# Patient Record
Sex: Female | Born: 1995 | Race: Black or African American | Hispanic: No | Marital: Married | Smoking: Never smoker
Health system: Southern US, Community
[De-identification: ages and names within clinical notes are randomized; demographics above are authoritative.]

## PROBLEM LIST (undated history)

## (undated) ENCOUNTER — Inpatient Hospital Stay (HOSPITAL_COMMUNITY): Payer: Self-pay

## (undated) ENCOUNTER — Ambulatory Visit (HOSPITAL_COMMUNITY): Source: Home / Self Care

## (undated) DIAGNOSIS — J45909 Unspecified asthma, uncomplicated: Secondary | ICD-10-CM

## (undated) DIAGNOSIS — O24419 Gestational diabetes mellitus in pregnancy, unspecified control: Secondary | ICD-10-CM

## (undated) HISTORY — PX: NO PAST SURGERIES: SHX2092

## (undated) HISTORY — DX: Gestational diabetes mellitus in pregnancy, unspecified control: O24.419

---

## 2016-09-21 NOTE — L&D Delivery Note (Signed)
Delivery Note At 8:40 PM a viable and healthy female was delivered via Vaginal, Vacuum (Extractor)  Indication forKiwi vacuum  : prolonged bradycardia in second stage  no popoffs, used x 2 contractions at standard pressures with release between contractions. (Presentation vertex ROP rotating spontaneously to ROA during expulsion.: ;  ).  APGAR:7/9 , ; weight  pending.   Placenta status: , .  Cord:  with the following complications: .thick meconium. Nuchal cord x 1.  Cord pH: not done  Anesthesia:  none Episiotomy: None Lacerations:  1st degree only no requiring repair. Suture Repair:  Est. Blood Loss (mL):  350  Mom to postpartum.  Baby to Couplet care / Skin to Skin.  Carlus Stay V 05/14/2017, 8:54 PM   Review the Delivery Report for details.

## 2016-11-02 LAB — OB RESULTS CONSOLE RUBELLA ANTIBODY, IGM: Rubella: IMMUNE

## 2016-11-02 LAB — OB RESULTS CONSOLE ABO/RH: RH Type: POSITIVE

## 2016-11-02 LAB — OB RESULTS CONSOLE HEPATITIS B SURFACE ANTIGEN: Hepatitis B Surface Ag: NEGATIVE

## 2016-11-02 LAB — OB RESULTS CONSOLE RPR: RPR: NONREACTIVE

## 2016-11-02 LAB — OB RESULTS CONSOLE ANTIBODY SCREEN: Antibody Screen: NEGATIVE

## 2016-11-02 LAB — OB RESULTS CONSOLE HIV ANTIBODY (ROUTINE TESTING): HIV: NONREACTIVE

## 2016-11-03 ENCOUNTER — Other Ambulatory Visit (HOSPITAL_COMMUNITY): Payer: Self-pay | Admitting: Nurse Practitioner

## 2016-11-03 DIAGNOSIS — Z3A13 13 weeks gestation of pregnancy: Secondary | ICD-10-CM

## 2016-11-03 DIAGNOSIS — Z3682 Encounter for antenatal screening for nuchal translucency: Secondary | ICD-10-CM

## 2016-11-06 ENCOUNTER — Encounter (HOSPITAL_COMMUNITY): Payer: Self-pay | Admitting: *Deleted

## 2016-11-09 ENCOUNTER — Ambulatory Visit (HOSPITAL_COMMUNITY)
Admission: RE | Admit: 2016-11-09 | Discharge: 2016-11-09 | Disposition: A | Discharge status: Home / Self Care | Payer: Medicaid Other | Source: Ambulatory Visit | Attending: Nurse Practitioner | Admitting: Nurse Practitioner

## 2016-11-09 ENCOUNTER — Encounter (HOSPITAL_COMMUNITY): Payer: Self-pay

## 2016-11-09 DIAGNOSIS — Z3682 Encounter for antenatal screening for nuchal translucency: Secondary | ICD-10-CM | POA: Diagnosis present

## 2016-11-09 DIAGNOSIS — Z3A13 13 weeks gestation of pregnancy: Secondary | ICD-10-CM | POA: Insufficient documentation

## 2016-11-09 HISTORY — DX: Unspecified asthma, uncomplicated: J45.909

## 2016-11-12 ENCOUNTER — Other Ambulatory Visit: Payer: Self-pay

## 2016-12-10 ENCOUNTER — Encounter (HOSPITAL_COMMUNITY): Payer: Self-pay

## 2016-12-10 ENCOUNTER — Inpatient Hospital Stay (HOSPITAL_COMMUNITY)
Admission: AD | Admit: 2016-12-10 | Discharge: 2016-12-10 | Disposition: A | Discharge status: Home / Self Care | Payer: Medicaid Other | Source: Ambulatory Visit | Attending: Obstetrics & Gynecology | Admitting: Obstetrics & Gynecology

## 2016-12-10 DIAGNOSIS — B9689 Other specified bacterial agents as the cause of diseases classified elsewhere: Secondary | ICD-10-CM | POA: Insufficient documentation

## 2016-12-10 DIAGNOSIS — O23592 Infection of other part of genital tract in pregnancy, second trimester: Secondary | ICD-10-CM | POA: Diagnosis not present

## 2016-12-10 DIAGNOSIS — R109 Unspecified abdominal pain: Secondary | ICD-10-CM | POA: Diagnosis present

## 2016-12-10 DIAGNOSIS — O4692 Antepartum hemorrhage, unspecified, second trimester: Secondary | ICD-10-CM | POA: Diagnosis not present

## 2016-12-10 DIAGNOSIS — Z3A17 17 weeks gestation of pregnancy: Secondary | ICD-10-CM | POA: Insufficient documentation

## 2016-12-10 DIAGNOSIS — Z6790 Unspecified blood type, Rh positive: Secondary | ICD-10-CM | POA: Diagnosis not present

## 2016-12-10 DIAGNOSIS — N760 Acute vaginitis: Secondary | ICD-10-CM | POA: Diagnosis not present

## 2016-12-10 LAB — URINALYSIS, ROUTINE W REFLEX MICROSCOPIC
BILIRUBIN URINE: NEGATIVE
Glucose, UA: NEGATIVE mg/dL
Hgb urine dipstick: NEGATIVE
KETONES UR: NEGATIVE mg/dL
Nitrite: NEGATIVE
PROTEIN: NEGATIVE mg/dL
Specific Gravity, Urine: 1.026 (ref 1.005–1.030)
pH: 6 (ref 5.0–8.0)

## 2016-12-10 LAB — CBC
HCT: 37.1 % (ref 36.0–46.0)
Hemoglobin: 12.8 g/dL (ref 12.0–15.0)
MCH: 32.1 pg (ref 26.0–34.0)
MCHC: 34.5 g/dL (ref 30.0–36.0)
MCV: 93 fL (ref 78.0–100.0)
PLATELETS: 258 10*3/uL (ref 150–400)
RBC: 3.99 MIL/uL (ref 3.87–5.11)
RDW: 14.6 % (ref 11.5–15.5)
WBC: 10.9 10*3/uL — AB (ref 4.0–10.5)

## 2016-12-10 LAB — ABO/RH: ABO/RH(D): A POS

## 2016-12-10 LAB — WET PREP, GENITAL
Sperm: NONE SEEN
TRICH WET PREP: NONE SEEN
YEAST WET PREP: NONE SEEN

## 2016-12-10 MED ORDER — METRONIDAZOLE 500 MG PO TABS: 500.0000 mg | ORAL_TABLET | Freq: Two times a day (BID) | ORAL | 0 refills | Status: DC

## 2016-12-10 MED ORDER — ACETAMINOPHEN 500 MG PO TABS
1000.0000 mg | ORAL_TABLET | Freq: Four times a day (QID) | ORAL | Status: DC | PRN
  Administered 2016-12-10: 1000 mg via ORAL
  Filled 2016-12-10: qty 2

## 2016-12-10 NOTE — MAU Note (Signed)
Patient presents with lower abdominal pain for the past 3 day patient states that at first it was intermittent but today the pain has been constant. Patient also states that when she had a bowl movement today and saw some blood. She has not seen any blood since.

## 2016-12-10 NOTE — MAU Provider Note (Signed)
History     CSN: [id number]  Arrival date and time: 12/10/16 1524   First Provider Initiated Contact with Patient 12/10/16 1557      Chief Complaint  Patient presents with  . Abdominal Pain   G2P0010 @17 .5 weeks here with LAP. Pain started 2 days ago. Describes as cramping and constant today but was intermittent when it first started. Rates 5/10. She has not used anything for the pain. She reports seeing red blood in the toilet and on the toilet paper about 1 hr ago after she had a BM. Reports normal white vaginal discharge prior. She is receiving care at the [facility].    OB History    Gravida Para Term Preterm AB Living   2       1     SAB TAB Ectopic Multiple Live Births   1              Past Medical History:  Diagnosis Date  . Asthma     Past Surgical History:  Procedure Laterality Date  . NO PAST SURGERIES      History reviewed. No pertinent family history.  Social History  Substance Use Topics  . Smoking status: Never Smoker  . Smokeless tobacco: Never Used  . Alcohol use No    Allergies:  Allergies  Allergen Reactions  . Bee Venom Anaphylaxis, Shortness Of Breath and Swelling    Prescriptions Prior to Admission  Medication Sig Dispense Refill Last Dose  . Prenatal Vit-Fe Fumarate-FA (PRENATAL VITAMIN PO) Take 1 tablet by mouth daily.    12/10/2016 at Unknown time    Review of Systems  Constitutional: Negative for fever.  Gastrointestinal: Positive for abdominal pain. Negative for constipation.  Genitourinary: Positive for vaginal bleeding and vaginal discharge. Negative for dysuria.   Physical Exam   Blood pressure (!) 117/56, pulse 86, temperature 98 F (36.7 C), temperature source Oral, resp. rate 16, last menstrual period 08/08/2016.  Physical Exam  Nursing note and vitals reviewed. Constitutional: She is oriented to person, place, and time. She appears well-developed and well-nourished. No distress.  HENT:  Head: Normocephalic and atraumatic.   Neck: Normal range of motion.  Cardiovascular: Normal rate.   Respiratory: Effort normal.  GI: Soft. She exhibits no distension and no mass. There is no tenderness. There is no rebound and no guarding.  Genitourinary:  Genitourinary Comments: External: no lesions or erythema Vagina: rugated, nulli, thin white frothy discharge SVE: closed/long   Musculoskeletal: Normal range of motion.  Neurological: She is alert and oriented to person, place, and time.  Skin: Skin is warm and dry.  Psychiatric: She has a normal mood and affect.  FHT: 150 bpm  Bedside US: active fetus, subj nml AFV, FHR 150s.  Results for orders placed or performed during the hospital encounter of 12/10/16 (from the past 24 hour(s))  Urinalysis, Routine w reflex microscopic     Status: Abnormal   Collection Time: 12/10/16  3:37 PM  Result Value Ref Range   Color, Urine YELLOW YELLOW   APPearance HAZY (A) CLEAR   Specific Gravity, Urine 1.026 1.005 - 1.030   pH 6.0 5.0 - 8.0   Glucose, UA NEGATIVE NEGATIVE mg/dL   Hgb urine dipstick NEGATIVE NEGATIVE   Bilirubin Urine NEGATIVE NEGATIVE   Ketones, ur NEGATIVE NEGATIVE mg/dL   Protein, ur NEGATIVE NEGATIVE mg/dL   Nitrite NEGATIVE NEGATIVE   Leukocytes, UA TRACE (A) NEGATIVE   RBC / HPF 0-5 0 - 5 RBC/hpf   WBC,  UA 6-30 0 - 5 WBC/hpf   Bacteria, UA FEW (A) NONE SEEN   Squamous Epithelial / LPF 6-30 (A) NONE SEEN   Mucous PRESENT   Wet prep, genital     Status: Abnormal   Collection Time: 12/10/16  4:08 PM  Result Value Ref Range   Yeast Wet Prep HPF POC NONE SEEN NONE SEEN   Trich, Wet Prep NONE SEEN NONE SEEN   Clue Cells Wet Prep HPF POC PRESENT (A) NONE SEEN   WBC, Wet Prep HPF POC MANY (A) NONE SEEN   Sperm NONE SEEN   ABO/Rh     Status: None (Preliminary result)   Collection Time: 12/10/16  4:13 PM  Result Value Ref Range   ABO/RH(D) A POS   CBC     Status: Abnormal   Collection Time: 12/10/16  4:13 PM  Result Value Ref Range   WBC 10.9 (H) 4.0 -  10.5 K/uL   RBC 3.99 3.87 - 5.11 MIL/uL   Hemoglobin 12.8 12.0 - 15.0 g/dL   HCT 86.537.1 78.436.0 - 69.646.0 %   MCV 93.0 78.0 - 100.0 fL   MCH 32.1 26.0 - 34.0 pg   MCHC 34.5 30.0 - 36.0 g/dL   RDW 29.514.6 28.411.5 - 13.215.5 %   Platelets 258 150 - 400 K/uL   MAU Course  Procedures Tylenol 1g po  MDM Labs ordered and reviewed. No evidence of UTI or threatened SAB. Pain likely caused by BV and/or physiologic to pregnancy. Stable for discharge home.   Assessment and Plan   1. [redacted] weeks gestation of pregnancy   2. Bacterial vaginosis   3.      Vaginal bleeding in pregnancy 4.      Rh pos  [facility] SAB precautions Rx Flagyl Tylenol and heating pad prn  Allergies as of 12/10/2016      Reactions   Bee Venom Anaphylaxis, Shortness Of Breath, Swelling      Medication List    TAKE these medications   metroNIDAZOLE 500 MG tablet Commonly known as:  FLAGYL Take 1 tablet (500 mg total) by mouth 2 (two) times daily.   PRENATAL VITAMIN PO Take 1 tablet by mouth daily.       Donette LarryMelanie Aundria Bitterman, CNM 12/10/2016, 3:59 PM

## 2016-12-10 NOTE — Discharge Instructions (Signed)
Bacterial Vaginosis Bacterial vaginosis is an infection of the vagina. It happens when too many germs (bacteria) grow in the vagina. This infection puts you at risk for infections from sex (STIs). Treating this infection can lower your risk for some STIs. You should also treat this if you are pregnant. It can cause your baby to be born early. Follow these instructions at home: Medicines   Take over-the-counter and prescription medicines only as told by your doctor.  Take or use your antibiotic medicine as told by your doctor. Do not stop taking or using it even if you start to feel better. General instructions   If you your sexual partner is a woman, tell her that you have this infection. She needs to get treatment if she has symptoms. If you have a female partner, he does not need to be treated.  During treatment:  Avoid sex.  Do not douche.  Avoid alcohol as told.  Avoid breastfeeding as told.  Drink enough fluid to keep your pee (urine) clear or pale yellow.  Keep your vagina and butt (rectum) clean.  Wash the area with warm water every day.  Wipe from front to back after you use the toilet.  Keep all follow-up visits as told by your doctor. This is important. Preventing this condition   Do not douche.  Use only warm water to wash around your vagina.  Use protection when you have sex. This includes:  Latex condoms.  Dental dams.  Limit how many people you have sex with. It is best to only have sex with the same person (be monogamous).  Get tested for STIs. Have your partner get tested.  Wear underwear that is cotton or lined with cotton.  Avoid tight pants and pantyhose. This is most important in summer.  Do not use any products that have nicotine or tobacco in them. These include cigarettes and e-cigarettes. If you need help quitting, ask your doctor.  Do not use illegal drugs.  Limit how much alcohol you drink. Contact a doctor if:  Your symptoms do not  get better, even after you are treated.  You have more discharge or pain when you pee (urinate).  You have a fever.  You have pain in your belly (abdomen).  You have pain with sex.  Your bleed from your vagina between periods. Summary  This infection happens when too many germs (bacteria) grow in the vagina.  Treating this condition can lower your risk for some infections from sex (STIs).  You should also treat this if you are pregnant. It can cause early (premature) birth.  Do not stop taking or using your antibiotic medicine even if you start to feel better. This information is not intended to replace advice given to you by your health care provider. Make sure you discuss any questions you have with your health care provider. Document Released: 06/16/2008 Document Revised: 05/23/2016 Document Reviewed: 05/23/2016 Elsevier Interactive Patient Education  2017 [employer]  Second Trimester of Pregnancy The second trimester is from week 13 through week 28, month 4 through 6. This is often the time in pregnancy that you feel your best. Often times, morning sickness has lessened or quit. You may have more energy, and you may get hungry more often. Your unborn baby (fetus) is growing rapidly. At the end of the sixth month, he or she is about 9 inches long and weighs about 1 pounds. You will likely feel the baby move (quickening) between 18 and 20 weeks of  pregnancy. Follow these instructions at home:  Avoid all smoking, herbs, and alcohol. Avoid drugs not approved by your doctor.  Do not use any tobacco products, including cigarettes, chewing tobacco, and electronic cigarettes. If you need help quitting, ask your doctor. You may get counseling or other support to help you quit.  Only take medicine as told by your doctor. Some medicines are safe and some are not during pregnancy.  Exercise only as told by your doctor. Stop exercising if you start having cramps.  Eat regular, healthy  meals.  Wear a good support bra if your breasts are tender.  Do not use hot tubs, steam rooms, or saunas.  Wear your seat belt when driving.  Avoid raw meat, uncooked cheese, and liter boxes and soil used by cats.  Take your prenatal vitamins.  Take 1500-2000 milligrams of calcium daily starting at the 20th week of pregnancy until you deliver your baby.  Try taking medicine that helps you poop (stool softener) as needed, and if your doctor approves. Eat more fiber by eating fresh fruit, vegetables, and whole grains. Drink enough fluids to keep your pee (urine) clear or pale yellow.  Take warm water baths (sitz baths) to soothe pain or discomfort caused by hemorrhoids. Use hemorrhoid cream if your doctor approves.  If you have puffy, bulging veins (varicose veins), wear support hose. Raise (elevate) your feet for 15 minutes, 3-4 times a day. Limit salt in your diet.  Avoid heavy lifting, wear low heals, and sit up straight.  Rest with your legs raised if you have leg cramps or low back pain.  Visit your dentist if you have not gone during your pregnancy. Use a soft toothbrush to brush your teeth. Be gentle when you floss.  You can have sex (intercourse) unless your doctor tells you not to.  Go to your doctor visits. Get help if:  You feel dizzy.  You have mild cramps or pressure in your lower belly (abdomen).  You have a nagging pain in your belly area.  You continue to feel sick to your stomach (nauseous), throw up (vomit), or have watery poop (diarrhea).  You have bad smelling fluid coming from your vagina.  You have pain with peeing (urination). Get help right away if:  You have a fever.  You are leaking fluid from your vagina.  You have spotting or bleeding from your vagina.  You have severe belly cramping or pain.  You lose or gain weight rapidly.  You have trouble catching your breath and have chest pain.  You notice sudden or extreme puffiness (swelling)  of your face, hands, ankles, feet, or legs.  You have not felt the baby move in over an hour.  You have severe headaches that do not go away with medicine.  You have vision changes. This information is not intended to replace advice given to you by your health care provider. Make sure you discuss any questions you have with your health care provider. Document Released: 12/02/2009 Document Revised: 02/13/2016 Document Reviewed: 11/08/2012 Elsevier Interactive Patient Education  2017 [employer].

## 2016-12-11 ENCOUNTER — Other Ambulatory Visit: Payer: Self-pay | Admitting: Student

## 2016-12-11 ENCOUNTER — Telehealth (HOSPITAL_COMMUNITY): Payer: Self-pay | Admitting: *Deleted

## 2016-12-11 DIAGNOSIS — N760 Acute vaginitis: Principal | ICD-10-CM

## 2016-12-11 DIAGNOSIS — B9689 Other specified bacterial agents as the cause of diseases classified elsewhere: Secondary | ICD-10-CM

## 2016-12-11 LAB — GC/CHLAMYDIA PROBE AMP (~~LOC~~) NOT AT ARMC
CHLAMYDIA, DNA PROBE: NEGATIVE
NEISSERIA GONORRHEA: NEGATIVE

## 2016-12-11 MED ORDER — METRONIDAZOLE 500 MG PO TABS: 500.0000 mg | ORAL_TABLET | Freq: Two times a day (BID) | ORAL | 0 refills | Status: DC

## 2017-01-12 ENCOUNTER — Inpatient Hospital Stay (HOSPITAL_COMMUNITY)
Admission: AD | Admit: 2017-01-12 | Discharge: 2017-01-12 | Disposition: A | Discharge status: Home / Self Care | Payer: Medicaid Other | Source: Ambulatory Visit | Attending: Family Medicine | Admitting: Family Medicine

## 2017-01-12 ENCOUNTER — Encounter (HOSPITAL_COMMUNITY): Payer: Self-pay | Admitting: *Deleted

## 2017-01-12 DIAGNOSIS — Z3A22 22 weeks gestation of pregnancy: Secondary | ICD-10-CM | POA: Insufficient documentation

## 2017-01-12 DIAGNOSIS — O219 Vomiting of pregnancy, unspecified: Secondary | ICD-10-CM | POA: Diagnosis not present

## 2017-01-12 DIAGNOSIS — Z888 Allergy status to other drugs, medicaments and biological substances status: Secondary | ICD-10-CM | POA: Insufficient documentation

## 2017-01-12 DIAGNOSIS — Z79899 Other long term (current) drug therapy: Secondary | ICD-10-CM | POA: Insufficient documentation

## 2017-01-12 DIAGNOSIS — A09 Infectious gastroenteritis and colitis, unspecified: Secondary | ICD-10-CM

## 2017-01-12 DIAGNOSIS — O99512 Diseases of the respiratory system complicating pregnancy, second trimester: Secondary | ICD-10-CM | POA: Insufficient documentation

## 2017-01-12 LAB — COMPREHENSIVE METABOLIC PANEL
ALT: 20 U/L (ref 14–54)
AST: 23 U/L (ref 15–41)
Albumin: 3 g/dL — ABNORMAL LOW (ref 3.5–5.0)
Alkaline Phosphatase: 53 U/L (ref 38–126)
Anion gap: 8 (ref 5–15)
BUN: <5 mg/dL — ABNORMAL LOW (ref 6–20)
CHLORIDE: 102 mmol/L (ref 101–111)
CO2: 22 mmol/L (ref 22–32)
Calcium: 9.1 mg/dL (ref 8.9–10.3)
Creatinine, Ser: 0.44 mg/dL (ref 0.44–1.00)
GFR calc Af Amer: >60 mL/min (ref >60)
Glucose, Bld: 91 mg/dL (ref 65–99)
Potassium: 3.6 mmol/L (ref 3.5–5.1)
Sodium: 132 mmol/L — ABNORMAL LOW (ref 135–145)
Total Bilirubin: 0.7 mg/dL (ref 0.3–1.2)
Total Protein: 7 g/dL (ref 6.5–8.1)

## 2017-01-12 LAB — URINALYSIS, ROUTINE W REFLEX MICROSCOPIC
BILIRUBIN URINE: NEGATIVE
Glucose, UA: NEGATIVE mg/dL
HGB URINE DIPSTICK: NEGATIVE
Ketones, ur: NEGATIVE mg/dL
Leukocytes, UA: NEGATIVE
Nitrite: NEGATIVE
Protein, ur: NEGATIVE mg/dL
SPECIFIC GRAVITY, URINE: 1.015 (ref 1.005–1.030)
pH: 5 (ref 5.0–8.0)

## 2017-01-12 LAB — CBC
HCT: 37.5 % (ref 36.0–46.0)
Hemoglobin: 13 g/dL (ref 12.0–15.0)
MCH: 32.9 pg (ref 26.0–34.0)
MCHC: 34.7 g/dL (ref 30.0–36.0)
MCV: 94.9 fL (ref 78.0–100.0)
PLATELETS: 228 10*3/uL (ref 150–400)
RBC: 3.95 MIL/uL (ref 3.87–5.11)
RDW: 14.7 % (ref 11.5–15.5)
WBC: 12.6 10*3/uL — AB (ref 4.0–10.5)

## 2017-01-12 MED ORDER — ACETAMINOPHEN 325 MG PO TABS
650.0000 mg | ORAL_TABLET | Freq: Four times a day (QID) | ORAL | Status: DC | PRN
Start: 2017-01-12 — End: 2017-01-12
  Administered 2017-01-12: 650 mg via ORAL
  Filled 2017-01-12: qty 2

## 2017-01-12 MED ORDER — METOCLOPRAMIDE HCL 10 MG PO TABS
10.0000 mg | ORAL_TABLET | Freq: Once | ORAL | Status: AC
  Administered 2017-01-12: 10 mg via ORAL

## 2017-01-12 MED ORDER — METOCLOPRAMIDE HCL 10 MG PO TABS
5.0000 mg | ORAL_TABLET | Freq: Once | ORAL | Status: DC
  Filled 2017-01-12: qty 1

## 2017-01-12 MED ORDER — METOCLOPRAMIDE HCL 5 MG PO TABS: 5.0000 mg | ORAL_TABLET | Freq: Four times a day (QID) | ORAL | 1 refills | Status: DC

## 2017-01-12 NOTE — Discharge Instructions (Signed)
Food Choices to Help Relieve Diarrhea, Adult When you have diarrhea, the foods you eat and your eating habits are very important. Choosing the right foods and drinks can help:  Relieve diarrhea.  Replace lost fluids and nutrients.  Prevent dehydration.  What general guidelines should I follow? Relieving diarrhea  Choose foods with less than 2 g or .07 oz. of fiber per serving.  Limit fats to less than 8 tsp (38 g or 1.34 oz.) a day.  Avoid the following: ? Foods and beverages sweetened with high-fructose corn syrup, honey, or sugar alcohols such as xylitol, sorbitol, and mannitol. ? Foods that contain a lot of fat or sugar. ? Fried, greasy, or spicy foods. ? High-fiber grains, breads, and cereals. ? Raw fruits and vegetables.  Eat foods that are rich in probiotics. These foods include dairy products such as yogurt and fermented milk products. They help increase healthy bacteria in the stomach and intestines (gastrointestinal tract, or GI tract).  If you have lactose intolerance, avoid dairy products. These may make your diarrhea worse.  Take medicine to help stop diarrhea (antidiarrheal medicine) only as told by your health care provider. Replacing nutrients  Eat small meals or snacks every 3-4 hours.  Eat bland foods, such as white rice, toast, or baked potato, until your diarrhea starts to get better. Gradually reintroduce nutrient-rich foods as tolerated or as told by your health care provider. This includes: ? Well-cooked protein foods. ? Peeled, seeded, and soft-cooked fruits and vegetables. ? Low-fat dairy products.  Take vitamin and mineral supplements as told by your health care provider. Preventing dehydration   Start by sipping water or a special solution to prevent dehydration (oral rehydration solution, ORS). Urine that is clear or pale yellow means that you are getting enough fluid.  Try to drink at least 8-10 cups of fluid each day to help replace lost  fluids.  You may add other liquids in addition to water, such as clear juice or decaffeinated sports drinks, as tolerated or as told by your health care provider.  Avoid drinks with caffeine, such as coffee, tea, or soft drinks.  Avoid alcohol. What foods are recommended? The items listed may not be a complete list. Talk with your health care provider about what dietary choices are best for you. Grains White rice. White, French, or pita breads (fresh or toasted), including plain rolls, buns, or bagels. White pasta. Saltine, soda, or graham crackers. Pretzels. Low-fiber cereal. Cooked cereals made with water (such as cornmeal, farina, or cream cereals). Plain muffins. Matzo. Melba toast. Zwieback. Vegetables Potatoes (without the skin). Most well-cooked and canned vegetables without skins or seeds. Tender lettuce. Fruits Apple sauce. Fruits canned in juice. Cooked apricots, cherries, grapefruit, peaches, pears, or plums. Fresh bananas and cantaloupe. Meats and other protein foods Baked or boiled chicken. Eggs. Tofu. Fish. Seafood. Smooth nut butters. Ground or well-cooked tender beef, ham, veal, lamb, pork, or poultry. Dairy Plain yogurt, kefir, and unsweetened liquid yogurt. Lactose-free milk, buttermilk, skim milk, or soy milk. Low-fat or nonfat hard cheese. Beverages Water. Low-calorie sports drinks. Fruit juices without pulp. Strained tomato and vegetable juices. Decaffeinated teas. Sugar-free beverages not sweetened with sugar alcohols. Oral rehydration solutions, if approved by your health care provider. Seasoning and other foods Bouillon, broth, or soups made from recommended foods. What foods are not recommended? The items listed may not be a complete list. Talk with your health care provider about what dietary choices are best for you. Grains Whole grain, whole wheat,   bran, or rye breads, rolls, pastas, and crackers. Wild or brown rice. Whole grain or bran cereals. Barley. Oats and  oatmeal. Corn tortillas or taco shells. Granola. Popcorn. Vegetables Raw vegetables. Fried vegetables. Cabbage, broccoli, Brussels sprouts, artichokes, baked beans, beet greens, corn, kale, legumes, peas, sweet potatoes, and yams. Potato skins. Cooked spinach and cabbage. Fruits Dried fruit, including raisins and dates. Raw fruits. Stewed or dried prunes. Canned fruits with syrup. Meat and other protein foods Fried or fatty meats. Deli meats. Chunky nut butters. Nuts and seeds. Beans and lentils. Bacon. Hot dogs. Sausage. Dairy High-fat cheeses. Whole milk, chocolate milk, and beverages made with milk, such as milk shakes. Half-and-half. Cream. sour cream. Ice cream. Beverages Caffeinated beverages (such as coffee, tea, soda, or energy drinks). Alcoholic beverages. Fruit juices with pulp. Prune juice. Soft drinks sweetened with high-fructose corn syrup or sugar alcohols. High-calorie sports drinks. Fats and oils Butter. Cream sauces. Margarine. Salad oils. Plain salad dressings. Olives. Avocados. Mayonnaise. Sweets and desserts Sweet rolls, doughnuts, and sweet breads. Sugar-free desserts sweetened with sugar alcohols such as xylitol and sorbitol. Seasoning and other foods Honey. Hot sauce. Chili powder. Gravy. Cream-based or milk-based soups. Pancakes and waffles. Summary  When you have diarrhea, the foods you eat and your eating habits are very important.  Make sure you get at least 8-10 cups of fluid each day, or enough to keep your urine clear or pale yellow.  Eat bland foods and gradually reintroduce healthy, nutrient-rich foods as tolerated, or as told by your health care provider.  Avoid high-fiber, fried, greasy, or spicy foods. This information is not intended to replace advice given to you by your health care provider. Make sure you discuss any questions you have with your health care provider. Document Released: 11/28/2003 Document Revised: 09/04/2016 Document Reviewed:  09/04/2016 Elsevier Interactive Patient Education  2017 [employer]vier Inc.  

## 2017-01-12 NOTE — MAU Provider Note (Signed)
History    Maria Dudley is a G2P0010 at [redacted]w[redacted]d Here with complaints of nausea, vomiting and diarrhea since yesterday morning. She denies bleeding, unusual vaginal discharge. She does feel fetal movements.      CSN: [id number]  Arrival date and time: 01/12/17 1243   First Provider Initiated Contact with Patient 01/12/17 1349      Chief Complaint  Patient presents with  . Emesis During Pregnancy  . Diarrhea   Diarrhea   The current episode started yesterday. The problem occurs 2 to 4 times per day (soft stool yesterday, diarrhea overnight, and then one small hard stool this morning). The problem has been gradually improving. The stool consistency is described as watery. Associated symptoms include vomiting.  Emesis   This is a new problem. The current episode started yesterday (threw up several times yesterday, then did not throw upovernight. She then threw up at 4 am and 8 am.  She has only had crackers and ginger ale yesterday, but nothing to eat today. ). The problem occurs 5 to 10 times per day. The problem has been unchanged. There has been no fever. Associated symptoms include diarrhea. She has tried diet change and increased fluids for the symptoms. The treatment provided no relief.    OB History    Gravida Para Term Preterm AB Living   2       1     SAB TAB Ectopic Multiple Live Births   1              Past Medical History:  Diagnosis Date  . Asthma     Past Surgical History:  Procedure Laterality Date  . NO PAST SURGERIES      History reviewed. No pertinent family history.  Social History  Substance Use Topics  . Smoking status: Never Smoker  . Smokeless tobacco: Never Used  . Alcohol use No    Allergies:  Allergies  Allergen Reactions  . Bee Venom Anaphylaxis, Shortness Of Breath and Swelling    Prescriptions Prior to Admission  Medication Sig Dispense Refill Last Dose  . metroNIDAZOLE (FLAGYL) 500 MG tablet Take 1 tablet (500 mg total) by  mouth 2 (two) times daily. 14 tablet 0   . Prenatal Vit-Fe Fumarate-FA (PRENATAL VITAMIN PO) Take 1 tablet by mouth daily.    12/10/2016 at Unknown time    Review of Systems  HENT: Negative.   Respiratory: Negative.   Cardiovascular: Negative.   Gastrointestinal: Positive for diarrhea and vomiting.  Genitourinary: Negative.   Musculoskeletal: Negative.    Physical Exam   Blood pressure 116/64, pulse (!) 110, temperature 98.9 F (37.2 C), temperature source Oral, resp. rate 16, height  (1.702 m), weight 63 kg (139 lb), last menstrual period 08/08/2016, SpO2 98 %.  Physical Exam  Constitutional: She is oriented to person, place, and time. She appears well-developed.  HENT:  Head: Normocephalic.  Neck: Normal range of motion.  Respiratory: Effort normal.  GI: Soft. She exhibits no distension and no mass. There is no tenderness. There is no rebound and no guarding.  Musculoskeletal: Normal range of motion.  Neurological: She is alert and oriented to person, place, and time. She has normal reflexes.  Skin: Skin is warm and dry.  Psychiatric: She has a normal mood and affect.    MAU Course  Procedures  MDM -Reglan 10 mg PO, Tylenol POfor pain -has not had any Vomiting or diarrhea since she's been here -ate crackers and juice without vomiting. Feels better;  wants to go home -FHR is 159 by doppler Assessment and Plan   1. Gastrointestinal infection    2. Patient stable for discharge; instructed patient on food choices to relieve diarrhea and vomiting  Charlesetta Garibaldi Asani Deniston 01/12/2017, 1:49 PM

## 2017-01-12 NOTE — MAU Note (Signed)
Pt C/O vomiting & diarrhea since yesterday morning.  Unable to hold down anything.  Four diarrhea stools since yesterday.  Has Abdominal pain when vomiting.  Denies bleeding.

## 2017-04-12 LAB — OB RESULTS CONSOLE GBS: STREP GROUP B AG: NEGATIVE

## 2017-04-12 LAB — OB RESULTS CONSOLE GC/CHLAMYDIA
CHLAMYDIA, DNA PROBE: NEGATIVE
GC PROBE AMP, GENITAL: NEGATIVE

## 2017-04-22 ENCOUNTER — Encounter (HOSPITAL_COMMUNITY): Payer: Self-pay | Admitting: *Deleted

## 2017-04-22 ENCOUNTER — Inpatient Hospital Stay (HOSPITAL_COMMUNITY)
Admission: AD | Admit: 2017-04-22 | Discharge: 2017-04-23 | Disposition: A | Discharge status: Home / Self Care | Payer: Medicaid Other | Source: Ambulatory Visit | Attending: Obstetrics and Gynecology | Admitting: Obstetrics and Gynecology

## 2017-04-22 DIAGNOSIS — O479 False labor, unspecified: Secondary | ICD-10-CM

## 2017-04-22 DIAGNOSIS — Z79899 Other long term (current) drug therapy: Secondary | ICD-10-CM | POA: Insufficient documentation

## 2017-04-22 DIAGNOSIS — J45909 Unspecified asthma, uncomplicated: Secondary | ICD-10-CM | POA: Insufficient documentation

## 2017-04-22 DIAGNOSIS — O4703 False labor before 37 completed weeks of gestation, third trimester: Secondary | ICD-10-CM | POA: Insufficient documentation

## 2017-04-22 DIAGNOSIS — Z3A36 36 weeks gestation of pregnancy: Secondary | ICD-10-CM | POA: Insufficient documentation

## 2017-04-22 DIAGNOSIS — O368390 Maternal care for abnormalities of the fetal heart rate or rhythm, unspecified trimester, not applicable or unspecified: Secondary | ICD-10-CM

## 2017-04-22 DIAGNOSIS — O99513 Diseases of the respiratory system complicating pregnancy, third trimester: Secondary | ICD-10-CM | POA: Insufficient documentation

## 2017-04-22 DIAGNOSIS — Z888 Allergy status to other drugs, medicaments and biological substances status: Secondary | ICD-10-CM | POA: Insufficient documentation

## 2017-04-22 DIAGNOSIS — Z809 Family history of malignant neoplasm, unspecified: Secondary | ICD-10-CM | POA: Insufficient documentation

## 2017-04-22 MED ORDER — LACTATED RINGERS IV BOLUS (SEPSIS): 1000.0000 mL | Freq: Once | INTRAVENOUS | Status: DC

## 2017-04-22 NOTE — MAU Provider Note (Signed)
Chief Complaint:  Labor Eval   First Provider Initiated Contact with Patient 04/22/2017 at 2253.    HPI: Maria Dudley is a 21 y.o. G2P0010 at 5461w5d who presents to maternity admissions reporting losing mucus plug and contractions. Was a term Charity fundraiserN labor check, but CNM was CTBS at 2253 for FHR decel the occurred w/ pt lying on her back during cervical exam.   Associated signs and symptoms: Neg for LOF, VB. Good fetal movement.   Pregnancy Course: uncomplicated. [facility]  Past Medical History:  Diagnosis Date  . Asthma    OB History  Gravida Para Term Preterm AB Living  2       1    SAB TAB Ectopic Multiple Live Births  1            # Outcome Date GA Lbr Len/2nd Weight Sex Delivery Anes PTL Lv  2 Current           1 SAB              Past Surgical History:  Procedure Laterality Date  . NO PAST SURGERIES     Family History  Problem Relation Age of Onset  . Cancer Maternal Grandfather    Social History  Substance Use Topics  . Smoking status: Never Smoker  . Smokeless tobacco: Never Used  . Alcohol use No   Allergies  Allergen Reactions  . Bee Venom Anaphylaxis, Shortness Of Breath and Swelling   Prescriptions Prior to Admission  Medication Sig Dispense Refill Last Dose  . Prenatal Vit-Fe Fumarate-FA (PRENATAL VITAMIN PO) Take 1 tablet by mouth daily.    04/22/2017 at Unknown time  . PROVENTIL HFA 108 (90 Base) MCG/ACT inhaler Inhale 1-2 puffs into the lungs every 4 (four) hours as needed.  0 04/21/2017 at Unknown time  . metoCLOPramide (REGLAN) 5 MG tablet Take 1 tablet (5 mg total) by mouth 4 (four) times daily. 10 tablet 1 More than a month at Unknown time  . metroNIDAZOLE (FLAGYL) 500 MG tablet Take 1 tablet (500 mg total) by mouth 2 (two) times daily. (Patient not taking: Reported on 01/12/2017) 14 tablet 0 More than a month at Unknown time    I have reviewed patient's Past Medical Hx, Surgical Hx, Family Hx, Social Hx,  medications and allergies.   ROS:  Review of Systems  Gastrointestinal: Positive for abdominal pain (contractions only).  Genitourinary: Negative for vaginal bleeding and vaginal discharge.    Physical Exam  Patient Vitals for the past 24 hrs:  BP Temp Temp src Pulse Resp Height Weight  04/22/17 2244 122/74 97.8 F (36.6 C) Oral 81 16 5\' 7"  (1.702 m) 165 lb (74.8 kg)   Constitutional: Well-developed, well-nourished female in no acute distress.  Cardiovascular: normal rate Respiratory: normal effort GI: Abd soft, non-tender, gravid appropriate for gestational age. Neurologic: Alert and oriented x 4.  GU: Closed/long cervix no bleeding, LOF per RN.   FHT:  Baseline 130 , moderate variability, accelerations present, 4 minute prolonged deceleration down to 90's while pt supine for cervical exam. Resolved w/ rolling to right lateral. IV bolus started and O2 given. No further decels on 1 hour 20 minutes of prolonged monitoring Contractions: Irreg, mild   Labs: No results found for this or any previous visit (from the past 24 hour(s)).  Imaging:  BPP 8/8, AFI 9.17, Vtx.  MAU Course: Orders Placed This Encounter  Procedures  . US MFM FETAL BPP WO NON STRESS  .  Contraction - monitoring  . External fetal heart monitoring  . Vaginal exam  . Oxygen therapy Mode or (Route): Non-rebreather; Liters Per Minute: 10  . Insert peripheral IV  . Discharge patient   Meds ordered this encounter  Medications  . lactated ringers bolus 1,000 mL   Discussed decel, exam w/ Dr. Vergie LivingPickens. New orders: BPP, AFI.   Discussed BPP, AFI results w/ Dr. Vergie LivingPickens. May D/C home.   MDM: - Mild, irreg contractions C/W False labor - FHR decel likely 2/2 maternal position. Fetal status overall reassuring.   Assessment: 1. Fetal heart rate decelerations affecting management of mother   2. False labor    Plan: Discharge home in stable condition.  Labor precautions and fetal kick counts  Follow-up  Information    [facility], [facility] up on 04/26/2017.   Why:  as scheduled Contact information: [address] [place] [id number] [phone]        [facility] AD[facility] Follow up.   Why:  in pregnancy emergencies Contact information: [address] [id number] [place]c [place] [id number] [phone]          Allergies as of 04/23/2017      Reactions   Bee Venom Anaphylaxis, Shortness Of Breath, Swelling      Medication List    TAKE these medications   metoCLOPramide 5 MG tablet Commonly known as:  REGLAN Take 1 tablet (5 mg total) by mouth 4 (four) times daily.   metroNIDAZOLE 500 MG tablet Commonly known as:  FLAGYL Take 1 tablet (500 mg total) by mouth 2 (two) times daily.   PRENATAL VITAMIN PO Take 1 tablet by mouth daily.   PROVENTIL HFA 108 (90 Base) MCG/ACT inhaler Generic drug:  albuterol Inhale 1-2 puffs into the lungs every 4 (four) hours as needed.       Katrinka BlazingSmith, [place], CNM 04/22/2017 11:02 PM

## 2017-04-23 ENCOUNTER — Inpatient Hospital Stay (HOSPITAL_COMMUNITY): Payer: Medicaid Other

## 2017-04-23 DIAGNOSIS — O368390 Maternal care for abnormalities of the fetal heart rate or rhythm, unspecified trimester, not applicable or unspecified: Secondary | ICD-10-CM | POA: Diagnosis not present

## 2017-04-23 DIAGNOSIS — O4703 False labor before 37 completed weeks of gestation, third trimester: Secondary | ICD-10-CM | POA: Diagnosis not present

## 2017-04-23 DIAGNOSIS — O99513 Diseases of the respiratory system complicating pregnancy, third trimester: Secondary | ICD-10-CM | POA: Diagnosis present

## 2017-04-23 DIAGNOSIS — Z79899 Other long term (current) drug therapy: Secondary | ICD-10-CM | POA: Diagnosis not present

## 2017-04-23 DIAGNOSIS — Z3A36 36 weeks gestation of pregnancy: Secondary | ICD-10-CM | POA: Diagnosis not present

## 2017-04-23 DIAGNOSIS — Z888 Allergy status to other drugs, medicaments and biological substances status: Secondary | ICD-10-CM | POA: Diagnosis not present

## 2017-04-23 DIAGNOSIS — O479 False labor, unspecified: Secondary | ICD-10-CM | POA: Diagnosis not present

## 2017-04-23 DIAGNOSIS — Z809 Family history of malignant neoplasm, unspecified: Secondary | ICD-10-CM | POA: Diagnosis not present

## 2017-04-23 DIAGNOSIS — J45909 Unspecified asthma, uncomplicated: Secondary | ICD-10-CM | POA: Diagnosis not present

## 2017-04-23 NOTE — MAU Note (Signed)
Urine in lab 

## 2017-04-23 NOTE — Discharge Instructions (Signed)
Braxton Hicks Contractions °Contractions of the uterus can occur throughout pregnancy, but they are not always a sign that you are in labor. You may have practice contractions called Braxton Hicks contractions. These false labor contractions are sometimes confused with true labor. °What are Braxton Hicks contractions? °Braxton Hicks contractions are tightening movements that occur in the muscles of the uterus before labor. Unlike true labor contractions, these contractions do not result in opening (dilation) and thinning of the cervix. Toward the end of pregnancy (32-34 weeks), Braxton Hicks contractions can happen more often and may become stronger. These contractions are sometimes difficult to tell apart from true labor because they can be very uncomfortable. You should not feel embarrassed if you go to the hospital with false labor. °Sometimes, the only way to tell if you are in true labor is for your health care provider to look for changes in the cervix. The health care provider will do a physical exam and may monitor your contractions. If you are not in true labor, the exam should show that your cervix is not dilating and your water has not broken. °If there are no prenatal problems or other health problems associated with your pregnancy, it is completely safe for you to be sent home with false labor. You may continue to have Braxton Hicks contractions until you go into true labor. °How can I tell the difference between true labor and false labor? °· Differences °? False labor °? Contractions last 30-70 seconds.: Contractions are usually shorter and not as strong as true labor contractions. °? Contractions become very regular.: Contractions are usually irregular. °? Discomfort is usually felt in the top of the uterus, and it spreads to the lower abdomen and low back.: Contractions are often felt in the front of the lower abdomen and in the groin. °? Contractions do not go away with walking.: Contractions may  go away when you walk around or change positions while lying down. °? Contractions usually become more intense and increase in frequency.: Contractions get weaker and are shorter-lasting as time goes on. °? The cervix dilates and gets thinner.: The cervix usually does not dilate or become thin. °Follow these instructions at home: °· Take over-the-counter and prescription medicines only as told by your health care provider. °· Keep up with your usual exercises and follow other instructions from your health care provider. °· Eat and drink lightly if you think you are going into labor. °· If Braxton Hicks contractions are making you uncomfortable: °? Change your position from lying down or resting to walking, or change from walking to resting. °? Sit and rest in a tub of warm water. °? Drink enough fluid to keep your urine clear or pale yellow. Dehydration may cause these contractions. °? Do slow and deep breathing several times an hour. °· Keep all follow-up prenatal visits as told by your health care provider. This is important. °Contact a health care provider if: °· You have a fever. °· You have continuous pain in your abdomen. °Get help right away if: °· Your contractions become stronger, more regular, and closer together. °· You have fluid leaking or gushing from your vagina. °· You pass blood-tinged mucus (bloody show). °· You have bleeding from your vagina. °· You have low back pain that you never had before. °· You feel your baby’s head pushing down and causing pelvic pressure. °· Your baby is not moving inside you as much as it used to. °Summary °· Contractions that occur before labor are   called Braxton Hicks contractions, false labor, or practice contractions. °· Braxton Hicks contractions are usually shorter, weaker, farther apart, and less regular than true labor contractions. True labor contractions usually become progressively stronger and regular and they become more frequent. °· Manage discomfort from  Braxton Hicks contractions by changing position, resting in a warm bath, drinking plenty of water, or practicing deep breathing. °This information is not intended to replace advice given to you by your health care provider. Make sure you discuss any questions you have with your health care provider. °Document Released: 09/07/2005 Document Revised: 07/27/2016 Document Reviewed: 07/27/2016 °Elsevier Interactive Patient Education © 2017 Elsevier Inc. ° ° °Fetal Movement Counts °Patient Name: ________________________________________________ Patient Due Date: ____________________ °What is a fetal movement count? °A fetal movement count is the number of times that you feel your baby move during a certain amount of time. This may also be called a fetal kick count. A fetal movement count is recommended for every pregnant woman. You may be asked to start counting fetal movements as early as week 28 of your pregnancy. °Pay attention to when your baby is most active. You may notice your baby's sleep and wake cycles. You may also notice things that make your baby move more. You should do a fetal movement count: °· When your baby is normally most active. °· At the same time each day. ° °A good time to count movements is while you are resting, after having something to eat and drink. °How do I count fetal movements? °1. Find a quiet, comfortable area. Sit, or lie down on your side. °2. Write down the date, the start time and stop time, and the number of movements that you felt between those two times. Take this information with you to your health care visits. °3. For 2 hours, count kicks, flutters, swishes, rolls, and jabs. You should feel at least 10 movements during 2 hours. °4. You may stop counting after you have felt 10 movements. °5. If you do not feel 10 movements in 2 hours, have something to eat and drink. Then, keep resting and counting for 1 hour. If you feel at least 4 movements during that hour, you may stop  counting. °Contact a health care provider if: °· You feel fewer than 4 movements in 2 hours. °· Your baby is not moving like he or she usually does. °Date: ____________ Start time: ____________ Stop time: ____________ Movements: ____________ °Date: ____________ Start time: ____________ Stop time: ____________ Movements: ____________ °Date: ____________ Start time: ____________ Stop time: ____________ Movements: ____________ °Date: ____________ Start time: ____________ Stop time: ____________ Movements: ____________ °Date: ____________ Start time: ____________ Stop time: ____________ Movements: ____________ °Date: ____________ Start time: ____________ Stop time: ____________ Movements: ____________ °Date: ____________ Start time: ____________ Stop time: ____________ Movements: ____________ °Date: ____________ Start time: ____________ Stop time: ____________ Movements: ____________ °Date: ____________ Start time: ____________ Stop time: ____________ Movements: ____________ °This information is not intended to replace advice given to you by your health care provider. Make sure you discuss any questions you have with your health care provider. °Document Released: 10/07/2006 Document Revised: 05/06/2016 Document Reviewed: 10/17/2015 °Elsevier Interactive Patient Education © 2018 Elsevier Inc. ° °

## 2017-05-11 ENCOUNTER — Inpatient Hospital Stay (HOSPITAL_COMMUNITY)
Admission: AD | Admit: 2017-05-11 | Discharge: 2017-05-11 | Disposition: A | Discharge status: Home / Self Care | Payer: Medicaid Other | Source: Ambulatory Visit | Attending: Family Medicine | Admitting: Family Medicine

## 2017-05-11 ENCOUNTER — Encounter (HOSPITAL_COMMUNITY): Payer: Self-pay

## 2017-05-11 DIAGNOSIS — Z3A39 39 weeks gestation of pregnancy: Secondary | ICD-10-CM | POA: Diagnosis not present

## 2017-05-11 DIAGNOSIS — O471 False labor at or after 37 completed weeks of gestation: Secondary | ICD-10-CM | POA: Diagnosis present

## 2017-05-11 DIAGNOSIS — O479 False labor, unspecified: Secondary | ICD-10-CM

## 2017-05-11 LAB — URINALYSIS, ROUTINE W REFLEX MICROSCOPIC
BILIRUBIN URINE: NEGATIVE
Glucose, UA: NEGATIVE mg/dL
Hgb urine dipstick: NEGATIVE
KETONES UR: NEGATIVE mg/dL
Leukocytes, UA: NEGATIVE
Nitrite: NEGATIVE
Protein, ur: NEGATIVE mg/dL
SPECIFIC GRAVITY, URINE: 1.015 (ref 1.005–1.030)
pH: 6 (ref 5.0–8.0)

## 2017-05-11 NOTE — Progress Notes (Addendum)
G2P0 @ 39.[redacted] wksga. Presents to triage for r/o labor. Denies LOF or bleeding. + FM. EFM applied.   SVE: closed  1530: provider notified. Report status of pt given. Orders received to cont monitor for another hr from last decel.  1620: Provider notified. Report status of fetal monitoring given. Ordered to prepare for discharge pt home. Monitors taken off and informed to to get dressed.   1700: discharge instructions given with pt understanding. Pt left unit via ambulatory.

## 2017-05-11 NOTE — MAU Note (Signed)
Urine in lab 

## 2017-05-13 ENCOUNTER — Encounter (HOSPITAL_COMMUNITY): Payer: Self-pay

## 2017-05-13 ENCOUNTER — Inpatient Hospital Stay (HOSPITAL_COMMUNITY): Payer: Medicaid Other

## 2017-05-13 ENCOUNTER — Inpatient Hospital Stay (EMERGENCY_DEPARTMENT_HOSPITAL)
Admission: AD | Admit: 2017-05-13 | Discharge: 2017-05-13 | Disposition: A | Discharge status: Home / Self Care | Payer: Medicaid Other | Source: Ambulatory Visit | Attending: Obstetrics & Gynecology | Admitting: Obstetrics & Gynecology

## 2017-05-13 DIAGNOSIS — O368390 Maternal care for abnormalities of the fetal heart rate or rhythm, unspecified trimester, not applicable or unspecified: Secondary | ICD-10-CM

## 2017-05-13 DIAGNOSIS — O479 False labor, unspecified: Secondary | ICD-10-CM

## 2017-05-13 NOTE — Discharge Instructions (Signed)
Braxton Hicks Contractions °Contractions of the uterus can occur throughout pregnancy, but they are not always a sign that you are in labor. You may have practice contractions called Braxton Hicks contractions. These false labor contractions are sometimes confused with true labor. °What are Braxton Hicks contractions? °Braxton Hicks contractions are tightening movements that occur in the muscles of the uterus before labor. Unlike true labor contractions, these contractions do not result in opening (dilation) and thinning of the cervix. Toward the end of pregnancy (32-34 weeks), Braxton Hicks contractions can happen more often and may become stronger. These contractions are sometimes difficult to tell apart from true labor because they can be very uncomfortable. You should not feel embarrassed if you go to the hospital with false labor. °Sometimes, the only way to tell if you are in true labor is for your health care provider to look for changes in the cervix. The health care provider will do a physical exam and may monitor your contractions. If you are not in true labor, the exam should show that your cervix is not dilating and your water has not broken. °If there are no prenatal problems or other health problems associated with your pregnancy, it is completely safe for you to be sent home with false labor. You may continue to have Braxton Hicks contractions until you go into true labor. °How can I tell the difference between true labor and false labor? °· Differences °? False labor °? Contractions last 30-70 seconds.: Contractions are usually shorter and not as strong as true labor contractions. °? Contractions become very regular.: Contractions are usually irregular. °? Discomfort is usually felt in the top of the uterus, and it spreads to the lower abdomen and low back.: Contractions are often felt in the front of the lower abdomen and in the groin. °? Contractions do not go away with walking.: Contractions may  go away when you walk around or change positions while lying down. °? Contractions usually become more intense and increase in frequency.: Contractions get weaker and are shorter-lasting as time goes on. °? The cervix dilates and gets thinner.: The cervix usually does not dilate or become thin. °Follow these instructions at home: °· Take over-the-counter and prescription medicines only as told by your health care provider. °· Keep up with your usual exercises and follow other instructions from your health care provider. °· Eat and drink lightly if you think you are going into labor. °· If Braxton Hicks contractions are making you uncomfortable: °? Change your position from lying down or resting to walking, or change from walking to resting. °? Sit and rest in a tub of warm water. °? Drink enough fluid to keep your urine clear or pale yellow. Dehydration may cause these contractions. °? Do slow and deep breathing several times an hour. °· Keep all follow-up prenatal visits as told by your health care provider. This is important. °Contact a health care provider if: °· You have a fever. °· You have continuous pain in your abdomen. °Get help right away if: °· Your contractions become stronger, more regular, and closer together. °· You have fluid leaking or gushing from your vagina. °· You pass blood-tinged mucus (bloody show). °· You have bleeding from your vagina. °· You have low back pain that you never had before. °· You feel your baby’s head pushing down and causing pelvic pressure. °· Your baby is not moving inside you as much as it used to. °Summary °· Contractions that occur before labor are   called Braxton Hicks contractions, false labor, or practice contractions. °· Braxton Hicks contractions are usually shorter, weaker, farther apart, and less regular than true labor contractions. True labor contractions usually become progressively stronger and regular and they become more frequent. °· Manage discomfort from  Braxton Hicks contractions by changing position, resting in a warm bath, drinking plenty of water, or practicing deep breathing. °This information is not intended to replace advice given to you by your health care provider. Make sure you discuss any questions you have with your health care provider. °Document Released: 09/07/2005 Document Revised: 07/27/2016 Document Reviewed: 07/27/2016 °Elsevier Interactive Patient Education © 2017 Elsevier Inc. ° ° °Fetal Movement Counts °Patient Name: ________________________________________________ Patient Due Date: ____________________ °What is a fetal movement count? °A fetal movement count is the number of times that you feel your baby move during a certain amount of time. This may also be called a fetal kick count. A fetal movement count is recommended for every pregnant woman. You may be asked to start counting fetal movements as early as week 28 of your pregnancy. °Pay attention to when your baby is most active. You may notice your baby's sleep and wake cycles. You may also notice things that make your baby move more. You should do a fetal movement count: °· When your baby is normally most active. °· At the same time each day. ° °A good time to count movements is while you are resting, after having something to eat and drink. °How do I count fetal movements? °1. Find a quiet, comfortable area. Sit, or lie down on your side. °2. Write down the date, the start time and stop time, and the number of movements that you felt between those two times. Take this information with you to your health care visits. °3. For 2 hours, count kicks, flutters, swishes, rolls, and jabs. You should feel at least 10 movements during 2 hours. °4. You may stop counting after you have felt 10 movements. °5. If you do not feel 10 movements in 2 hours, have something to eat and drink. Then, keep resting and counting for 1 hour. If you feel at least 4 movements during that hour, you may stop  counting. °Contact a health care provider if: °· You feel fewer than 4 movements in 2 hours. °· Your baby is not moving like he or she usually does. °Date: ____________ Start time: ____________ Stop time: ____________ Movements: ____________ °Date: ____________ Start time: ____________ Stop time: ____________ Movements: ____________ °Date: ____________ Start time: ____________ Stop time: ____________ Movements: ____________ °Date: ____________ Start time: ____________ Stop time: ____________ Movements: ____________ °Date: ____________ Start time: ____________ Stop time: ____________ Movements: ____________ °Date: ____________ Start time: ____________ Stop time: ____________ Movements: ____________ °Date: ____________ Start time: ____________ Stop time: ____________ Movements: ____________ °Date: ____________ Start time: ____________ Stop time: ____________ Movements: ____________ °Date: ____________ Start time: ____________ Stop time: ____________ Movements: ____________ °This information is not intended to replace advice given to you by your health care provider. Make sure you discuss any questions you have with your health care provider. °Document Released: 10/07/2006 Document Revised: 05/06/2016 Document Reviewed: 10/17/2015 °Elsevier Interactive Patient Education © 2018 Elsevier Inc. ° °

## 2017-05-13 NOTE — MAU Note (Signed)
+  contractions 5-10 minutes Rating 6/10  +vaginal discharge Brown/reddish in color  Last SVE was closed--this week

## 2017-05-13 NOTE — MAU Provider Note (Signed)
CC:  Chief Complaint  Patient presents with  . Contractions  . Vaginal Discharge    First Provider Initiated Contact with Patient 05/13/2017 at 1617.    HPI: Maria Dudley is a 21 y.o. year old G61P0010 female at [redacted]w[redacted]d weeks gestation who presents to MAU reporting mild contractions and scant bloody show. Cervic closed per RN w/ no bleeding, but CNM was asked to see pt 2/2 few decels.   Associated Sx:  Vaginal bleeding: Scant Leaking of fluid: Denies Fetal movement: Nml  O: Patient Vitals for the past 24 hrs:  BP Temp Temp src Pulse Resp SpO2 Weight  05/13/17 1517 115/68 97.7 F (36.5 C) Oral 78 16 98 % 171 lb 1.3 oz (77.6 kg)    General: NAD Heart: Regular rate Lungs: Normal rate and effort Abd: Soft, NT, Gravid, S=D Pelvic: NEFG, Neg pooling, No blood.  Dilation: Closed Effacement (%): 50 Cervical Position: Posterior Station: -2 Presentation: Vertex Exam by:: Ginnie Smart RN  EFM: 140, Moderate variability, 1 15 x 15 acceleration on initail tracing, but multiple 15x 15 accels after BPP. Few mild variable decelerations. One early decel.  Toco: Contractions rare, mild  A: [redacted]w[redacted]d week IUP Braxton Hicks Scant bloody show Few decels, but fetal status reassuring w/ FHR reactive, BPP 10/10 and Neg CST.   P: Discharge home in stable condition per consult with Adam Phenix, MD. Labor labor precautions and fetal kick counts. Follow-up as scheduled for prenatal visit or sooner as needed if symptoms worsen. Return to maternity admissions as needed if symptoms worsen.  [place], CNM 09/14/2016 11:40 PM  3

## 2017-05-14 ENCOUNTER — Inpatient Hospital Stay (HOSPITAL_COMMUNITY)
Admission: AD | Admit: 2017-05-14 | Discharge: 2017-05-16 | DRG: 775 | Disposition: A | Discharge status: Home / Self Care | Payer: Medicaid Other | Source: Ambulatory Visit | Attending: Family Medicine | Admitting: Family Medicine

## 2017-05-14 ENCOUNTER — Encounter (HOSPITAL_COMMUNITY): Payer: Self-pay | Admitting: *Deleted

## 2017-05-14 DIAGNOSIS — Z3493 Encounter for supervision of normal pregnancy, unspecified, third trimester: Secondary | ICD-10-CM | POA: Diagnosis present

## 2017-05-14 DIAGNOSIS — O479 False labor, unspecified: Secondary | ICD-10-CM | POA: Diagnosis not present

## 2017-05-14 DIAGNOSIS — J45909 Unspecified asthma, uncomplicated: Secondary | ICD-10-CM

## 2017-05-14 DIAGNOSIS — IMO0002 Reserved for concepts with insufficient information to code with codable children: Secondary | ICD-10-CM

## 2017-05-14 DIAGNOSIS — J45901 Unspecified asthma with (acute) exacerbation: Secondary | ICD-10-CM | POA: Diagnosis present

## 2017-05-14 DIAGNOSIS — O9952 Diseases of the respiratory system complicating childbirth: Secondary | ICD-10-CM | POA: Diagnosis present

## 2017-05-14 DIAGNOSIS — Z3A39 39 weeks gestation of pregnancy: Secondary | ICD-10-CM | POA: Diagnosis not present

## 2017-05-14 DIAGNOSIS — Z62810 Personal history of physical and sexual abuse in childhood: Secondary | ICD-10-CM

## 2017-05-14 LAB — CBC
HEMATOCRIT: 43.2 % (ref 36.0–46.0)
HEMOGLOBIN: 15.4 g/dL — AB (ref 12.0–15.0)
MCH: 33.6 pg (ref 26.0–34.0)
MCHC: 35.6 g/dL (ref 30.0–36.0)
MCV: 94.1 fL (ref 78.0–100.0)
Platelets: 196 10*3/uL (ref 150–400)
RBC: 4.59 MIL/uL (ref 3.87–5.11)
RDW: 12.9 % (ref 11.5–15.5)
WBC: 12.8 10*3/uL — ABNORMAL HIGH (ref 4.0–10.5)

## 2017-05-14 LAB — TYPE AND SCREEN
ABO/RH(D): A POS
Antibody Screen: NEGATIVE

## 2017-05-14 MED ORDER — SENNOSIDES-DOCUSATE SODIUM 8.6-50 MG PO TABS
2.0000 | ORAL_TABLET | ORAL | Status: DC
  Administered 2017-05-15 – 2017-05-16 (×2): 2 via ORAL
  Filled 2017-05-14 (×2): qty 2

## 2017-05-14 MED ORDER — LACTATED RINGERS IV SOLN
500.0000 mL | INTRAVENOUS | Status: DC | PRN
Start: 2017-05-14 — End: 2017-05-14
  Administered 2017-05-14: 500 mL via INTRAVENOUS

## 2017-05-14 MED ORDER — ACETAMINOPHEN 325 MG PO TABS: 650.0000 mg | ORAL_TABLET | ORAL | Status: DC | PRN

## 2017-05-14 MED ORDER — TETANUS-DIPHTH-ACELL PERTUSSIS 5-2.5-18.5 LF-MCG/0.5 IM SUSP: 0.5000 mL | Freq: Once | INTRAMUSCULAR | Status: DC

## 2017-05-14 MED ORDER — ONDANSETRON HCL 4 MG/2ML IJ SOLN
4.0000 mg | Freq: Four times a day (QID) | INTRAMUSCULAR | Status: DC | PRN
  Administered 2017-05-14: 4 mg via INTRAVENOUS
  Filled 2017-05-14: qty 2

## 2017-05-14 MED ORDER — FENTANYL CITRATE (PF) 100 MCG/2ML IJ SOLN: 100.0000 ug | INTRAMUSCULAR | Status: DC | PRN

## 2017-05-14 MED ORDER — ONDANSETRON HCL 4 MG/2ML IJ SOLN: 4.0000 mg | INTRAMUSCULAR | Status: DC | PRN

## 2017-05-14 MED ORDER — OXYCODONE-ACETAMINOPHEN 5-325 MG PO TABS
2.0000 | ORAL_TABLET | ORAL | Status: DC | PRN
Start: 2017-05-14 — End: 2017-05-14

## 2017-05-14 MED ORDER — LACTATED RINGERS IV SOLN
INTRAVENOUS | Status: DC
  Administered 2017-05-14: 14:00:00 via INTRAVENOUS

## 2017-05-14 MED ORDER — IBUPROFEN 600 MG PO TABS
600.0000 mg | ORAL_TABLET | Freq: Four times a day (QID) | ORAL | Status: DC
  Administered 2017-05-15 – 2017-05-16 (×5): 600 mg via ORAL
  Filled 2017-05-14 (×6): qty 1

## 2017-05-14 MED ORDER — ONDANSETRON HCL 4 MG PO TABS: 4.0000 mg | ORAL_TABLET | ORAL | Status: DC | PRN

## 2017-05-14 MED ORDER — DIPHENHYDRAMINE HCL 25 MG PO CAPS: 25.0000 mg | ORAL_CAPSULE | Freq: Four times a day (QID) | ORAL | Status: DC | PRN

## 2017-05-14 MED ORDER — OXYCODONE-ACETAMINOPHEN 5-325 MG PO TABS: 1.0000 | ORAL_TABLET | ORAL | Status: DC | PRN

## 2017-05-14 MED ORDER — OXYTOCIN BOLUS FROM INFUSION: 500.0000 mL | Freq: Once | INTRAVENOUS | Status: DC

## 2017-05-14 MED ORDER — DIBUCAINE 1 % RE OINT
1.0000 "application " | TOPICAL_OINTMENT | RECTAL | Status: DC | PRN
  Filled 2017-05-14: qty 28

## 2017-05-14 MED ORDER — LIDOCAINE HCL (PF) 1 % IJ SOLN
30.0000 mL | INTRAMUSCULAR | Status: DC | PRN
  Filled 2017-05-14: qty 30

## 2017-05-14 MED ORDER — COCONUT OIL OIL: 1.0000 "application " | TOPICAL_OIL | Status: DC | PRN

## 2017-05-14 MED ORDER — WITCH HAZEL-GLYCERIN EX PADS
1.0000 "application " | MEDICATED_PAD | CUTANEOUS | Status: DC | PRN
  Administered 2017-05-16: 1 via TOPICAL

## 2017-05-14 MED ORDER — OXYTOCIN 40 UNITS IN LACTATED RINGERS INFUSION - SIMPLE MED
2.5000 [IU]/h | INTRAVENOUS | Status: DC
  Filled 2017-05-14: qty 1000

## 2017-05-14 MED ORDER — PRENATAL MULTIVITAMIN CH
1.0000 | ORAL_TABLET | Freq: Every day | ORAL | Status: DC
  Administered 2017-05-15 – 2017-05-16 (×2): 1 via ORAL
  Filled 2017-05-14 (×2): qty 1

## 2017-05-14 MED ORDER — BENZOCAINE-MENTHOL 20-0.5 % EX AERO
1.0000 "application " | INHALATION_SPRAY | CUTANEOUS | Status: DC | PRN
  Administered 2017-05-15: 1 via TOPICAL
  Filled 2017-05-14: qty 56

## 2017-05-14 MED ORDER — SOD CITRATE-CITRIC ACID 500-334 MG/5ML PO SOLN: 30.0000 mL | ORAL | Status: DC | PRN

## 2017-05-14 MED ORDER — ZOLPIDEM TARTRATE 5 MG PO TABS
5.0000 mg | ORAL_TABLET | Freq: Every evening | ORAL | Status: DC | PRN
Start: 2017-05-14 — End: 2017-05-16

## 2017-05-14 MED ORDER — SIMETHICONE 80 MG PO CHEW: 80.0000 mg | CHEWABLE_TABLET | ORAL | Status: DC | PRN

## 2017-05-14 NOTE — Consult Note (Signed)
Neonatology Note:   Attendance at C-section:    I was asked by Dr. Feruson to attend this vaginal deivery for thick meconium and decels at term. The mother is a G2P0010, GBS negative with good prenatal care. ROM 1 hours before delivery, fluid thick meconium. Infant vigorous with good spontaneous cry and tone. Needed only minimal bulb suctioning. Dried and stimulated.  Ap 8/9. Lungs clear to ausc in DR. To CN to care of Pediatrician.  Maria Dudley C. Maria Gritz, MD 

## 2017-05-14 NOTE — Anesthesia Pain Management Evaluation Note (Signed)
  CRNA Pain Management Visit Note  Patient: Maria Dudley, 21 y.o., female  "Hello I am a member of the anesthesia team at [facility]. We have an anesthesia team available at all times to provide care throughout the hospital, including epidural management and anesthesia for C-section. I don't know your plan for the delivery whether it a natural birth, water birth, IV sedation, nitrous supplementation, doula or epidural, but we want to meet your pain goals."   1.Was your pain managed to your expectations on prior hospitalizations?   Yes   2.What is your expectation for pain management during this hospitalization?     Labor support without medications  3.How can we help you reach that goal? Standby  Record the patient's initial score and the patient's pain goal.   Pain: 6  Pain Goal: 8 [facility] wants you to be able to say your pain was always managed very well.  [name] 05/14/2017

## 2017-05-14 NOTE — MAU Note (Signed)
Decreased fetal heart rate at 1117, pt. Repositioned audible increase in fetal heart rate, patient repositioned with another RN at the [facility] to start IV. Prolonged decel noted with return to a baseline of 120s, after nursing interventions, Dr. Frances Furbish notified.  After stablizing patient, patient transferred to L&D, upon admission to L&D, FHR 120s.

## 2017-05-14 NOTE — Progress Notes (Signed)
Central monitoring down. RN at bedside.

## 2017-05-14 NOTE — Progress Notes (Signed)
Labor Progress Note  Maria Dudley is a 21 y.o. female G2P0010 with IUP at [redacted]w[redacted]d by LMP c/w 1st trimester U/S presenting for SOL.  S: Patient seen & examined for progress of labor. Patient in moderate distress, especially during contractions, and is laying on right side.  O: BP 111/67 (BP Location: Right Arm)   Pulse (!) 116   Temp 98.7 F (37.1 C) (Oral)   Resp 20   Ht 5\' 7"  (1.702 m)   Wt 76.7 kg (169 lb)   LMP 08/08/2016   SpO2 100%   BMI 26.47 kg/m   FHT: 145 bpm, mod var, +accels, early and variable decels TOCO: q3-30min, patient is very uncomfortable during contractions but is breathing well through them  CVE: Dilation: 8 Effacement (%): 80, 90 Cervical Position: Middle Station: -1 Presentation: Vertex Exam by:: Earlene Plater, RN   A&P: 21 y.o. G2P0010 [redacted]w[redacted]d here for SOL  Labor:Pt progressing well without medication, continue expectant management Fetal Wellbeing:Cat I Pain Control: NO gas Anticipated MOD:SVD   Obie Dredge Medical Student 05/14/2017 6:34 PM

## 2017-05-14 NOTE — MAU Note (Signed)
Hardie Shackleton, RN assume care of patient, report received from charge nurse.
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
## 2017-05-14 NOTE — Progress Notes (Signed)
Labor Progress Note  S: Patient seen & examined for progress of labor. Patient is uncomfortable during contractions and is breathing through them.   O: BP (!) 106/56 (BP Location: Left Arm)   Pulse 74   Temp 98.4 F (36.9 C) (Oral)   Resp 18   Ht 5\' 7"  (1.702 m)   Wt 76.7 kg (169 lb)   LMP 08/08/2016   SpO2 99%   BMI 26.47 kg/m   FHT: 140bpm, mod var, +accels, some variable decels TOCO: q69min, patient looks comfortable during contractions  CVE: Dilation: 6 Effacement (%): 80, 90 Cervical Position: Middle Station: -1 Presentation: Vertex Exam by:: Deatra Robinson, RN   A&P: 21 y.o. G2P0010 [redacted]w[redacted]d here for SOL with decelerations in FHT  Was encouraged to ask for pain medications if she wants/needs them. Will continue to monitor FHT closely and adjust management depending on tracing. Patient is progressing well - continue expectant management Anticipate SVD  Lezlie Octave, MD [facility] Resident PGY-1 05/14/2017 3:14 PM

## 2017-05-15 LAB — CBC
HEMATOCRIT: 35.9 % — AB (ref 36.0–46.0)
HEMOGLOBIN: 12.5 g/dL (ref 12.0–15.0)
MCH: 33 pg (ref 26.0–34.0)
MCHC: 34.8 g/dL (ref 30.0–36.0)
MCV: 94.7 fL (ref 78.0–100.0)
Platelets: 177 10*3/uL (ref 150–400)
RBC: 3.79 MIL/uL — ABNORMAL LOW (ref 3.87–5.11)
RDW: 12.9 % (ref 11.5–15.5)
WBC: 17.5 10*3/uL — ABNORMAL HIGH (ref 4.0–10.5)

## 2017-05-15 LAB — RPR: RPR Ser Ql: NONREACTIVE

## 2017-05-15 MED ORDER — IPRATROPIUM-ALBUTEROL 0.5-2.5 (3) MG/3ML IN SOLN
3.0000 mL | Freq: Once | RESPIRATORY_TRACT | Status: AC
  Administered 2017-05-15: 3 mL via RESPIRATORY_TRACT
  Filled 2017-05-15: qty 3

## 2017-05-15 MED ORDER — ALBUTEROL SULFATE (2.5 MG/3ML) 0.083% IN NEBU
2.5000 mg | INHALATION_SOLUTION | RESPIRATORY_TRACT | Status: DC | PRN
  Filled 2017-05-15: qty 3

## 2017-05-15 NOTE — Progress Notes (Addendum)
Post Partum Day 1 Subjective: no complaints, up ad lib, voiding and tolerating PO Pt had episode of SOB/chest tightness similar to previous asthma attacks. She usually uses albuterol inhaler PRN at home.  Objective: Blood pressure 107/65, pulse 85, temperature 98.1 F (36.7 C), temperature source Axillary, resp. rate (!) 24, height 5\' 7"  (1.702 m), weight 169 lb (76.7 kg), last menstrual period 08/08/2016, SpO2 98 %.  Nebulizer treatment with albuterol/ipratropium given by respiratory staff x 1 dose  Physical Exam:  General: alert, cooperative and no distress  HEART: normal rate, heart sounds, regular rhythm RESP: normal effort, lung sounds clear and equal bilaterally Lochia: appropriate Uterine Fundus: firm Incision: n/a DVT Evaluation: No evidence of DVT seen on physical exam.   Recent Labs  05/14/17 1200 05/15/17 0537  HGB 15.4* 12.5  HCT 43.2 35.9*    Assessment/Plan: Plan for discharge tomorrow, Breastfeeding and Contraception condoms  Albuterol rescue inhaler PRN while in hospital   LOS: 1 day   Sharen Counter 05/15/2017, 9:21 PM

## 2017-05-15 NOTE — Lactation Note (Signed)
This note was copied from a baby's chart. Lactation Consultation Note  Patient Name: Maria Dudley Date: 05/15/2017 Reason for consult: Initial assessment  Baby is 13 hours old at the start of the [facility] consult When [facility] entered the room the [facility] had assisted mom to latch the baby on the right breast with #20 NS. LC noted the baby to be latched , but pulling away or falling asleep.  LC released baby from the breast, woke him up , and worked with mom to latch without the NS due the areola  Being more compressible after reverse pressure and hand expressing, few drops of colostrum noted.  At 1st baby was latched shallow, and sucking on tongue and lip. LC had baby suck on a gloved finger and  Used tongue exercises to get baby to relax tongue,immediately after the pulling out the finger from the baby's  Mouth latched and baby fed for 4 mins with swallows, FISH lips/ depth obtained. Pumping the other breast also  Helped to enhance the let down on the right.  Baby STS and then started to root. LC switched and worked with baby to latch on the left / football/ depth obtained/ and fed 6 mins with swallows. LC had mom pumped on the right when feeding on the left/ mom started cramping/ and colostrum flowing/  More swallows on the left/ and depth.  After feeding baby laying STS on moms chest.  Mom seems very motivated to breast feed and will need assistance until the baby is more consistent with latching.  Dad and grandmother are supportive.   Lactation Plan of Care:  LC instructed mom on the use of shells for between feeding except when sleeping.  ( indicated due to resolving areola edema ) ( mom aware of how to use them )  Prior to latch - breast massage , hand express( mom does it well ) , pre-pump if needed with hand pump,  Reverse pressure , latch with breast compressions until swallows, and then intermittent.  Post pump with her own DEBP [facility] showed mom how to use it  ).    Maternal Data Has patient been taught Hand Expression?: Yes Does the patient have breastfeeding experience prior to this delivery?: No  Feeding Feeding Type: Breast Fed (left breast ) Length of feed: 6 min (increased swallows noted )  LATCH Score Latch: Grasps breast easily, tongue down, lips flanged, rhythmical sucking.  Audible Swallowing: A few with stimulation  Type of Nipple: Everted at rest and after stimulation (areola compressible )  Comfort (Breast/Nipple): Soft / non-tender  Hold (Positioning): Assistance needed to correctly position infant at breast and maintain latch.  LATCH Score: 8  Interventions Interventions: Breast feeding basics reviewed  Lactation Tools Discussed/Used Tools: Shells (mom plans to wear with bra when not STS ) Nipple shield size:  (not needed for latch ) Shell Type: Inverted Breast pump type:  (had mom pump left breast until swallows on right ) Pump Review: Setup, frequency, and cleaning Initiated by:: MAI  Date initiated:: 05/15/17   Consult Status Consult Status: Follow-up Date: 05/16/17 Follow-up type: In-patient    Maria Dudley 05/15/2017, 11:35 AM

## 2017-05-15 NOTE — Progress Notes (Signed)
Patient ID: Maria Dudley, female   DOB: 25-Dec-1995, 21 y.o.   MRN: [id number]  S: Called by RN to report pt feeling chest tightness and shortness of breath.  Pt has hx of asthma and feels like she needs her inhaler.    O: BP 107/65 (BP Location: Left Arm)   Pulse 85   Temp 98.1 F (36.7 C) (Axillary)   Resp (!) 24   Ht 5\' 7"  (1.702 m)   Wt 169 lb (76.7 kg)   LMP 08/08/2016   SpO2 98%   BMI 26.47 kg/m    A: Likely asthma exacerbation  P: Duoneb treatment from respiratory x 1, then PRN inhaler   Sharen Counter, CNM 6:57 PM
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
## 2017-05-16 MED ORDER — IBUPROFEN 600 MG PO TABS: 600.0000 mg | ORAL_TABLET | Freq: Four times a day (QID) | ORAL | 0 refills | Status: DC

## 2017-05-16 NOTE — Discharge Instructions (Signed)

## 2017-05-16 NOTE — Lactation Note (Signed)
This note was copied from a baby's chart. Lactation Consultation Note  Patient Name: Maria Dudley UXLKG'M Date: 05/16/2017 Reason for consult: Follow-up assessment;Infant weight loss  Baby is 65 hours old  As LC entered the room baby latched on the right breast. In modified  Laid back position. Multiple swallows noted and per mom comfortable.  Latch without the Nipple Shield. Per mom the baby latches so much better  Without the Nipple Shield. When baby released the areola improved for  Being compressible compared to yesterday. Per mom has been using the  Breast shells when not STS, and pumping with her DEBP.  Baby acting still hungry , LC assisted to latch on the left breast modified laid back,  With depth and baby fed for 15 mins , multiple swallows, increased with breast compressions.  After baby came off 1st breast  , when baby was crying noted the baby to have a short  Frenulum. Is not causing latch issues if the depth is achieved. If depth isn't achieved  Shallow uncomfortable latch. May be a problem when the milk comes in and also  Was a problem with latching yesterday due to areola edema. Nipple shield was started  Early on and yesterday , LC worked with mom and was able to latch the baby without the NS Both breast.  LC shared findings with facility NP Barnetta Chapel  and she planned to discuss the [facility]  Findings with the Dr. Erik Obey. LC also recommended to mom when returning to [facility] children to have the Dr.  Otelia Santee and the short frenulum.  Sore nipple and engorgement prevention and tx reviewed.  Discussed nutritive vs non - nutritive feeding patterns and to watch for hanging out at the breast.  LC stressed to mom when the baby is latched the baby needs to be in and intermittent feeding pattern  With swallows. Stressed the importance of breast compressions until swallows.  Mom has a hand pump for pre-pump and per Medela of post pumping if needed.  LC praised mom  for her efforts and grandmother for her breast feeding support.   LC mentioned to mom is the [facility] doctor decide to release short frenulum call back for  [facility] O/P appt appt. For feeding assessment. Mom receptive.    Maternal Data Has patient been taught Hand Expression?: Yes  Feeding Feeding Type: Breast Fed Length of feed: 24 min (per mom start time, and multiple swallows )  LATCH Score Latch: Grasps breast easily, tongue down, lips flanged, rhythmical sucking.  Audible Swallowing: Spontaneous and intermittent  Type of Nipple: Everted at rest and after stimulation  Comfort (Breast/Nipple): Filling, red/small blisters or bruises, mild/mod discomfort  Hold (Positioning): Assistance needed to correctly position infant at breast and maintain latch.  LATCH Score: 8  Interventions Interventions: Breast feeding basics reviewed;Assisted with latch;Skin to skin;Breast massage;Hand express;Pre-pump if needed;Reverse pressure;Breast compression;Adjust position;Support pillows;Position options;Expressed milk;Shells;Hand pump;DEBP  Lactation Tools Discussed/Used Tools: Shells Nipple shield size: 20 Shell Type: Inverted Breast pump type: Manual;Other (comment)   Consult Status Consult Status: Complete Date: 05/16/17 Follow-up type: In-patient    Maria Dudley 05/16/2017, 11:22 AM
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
## 2017-05-20 ENCOUNTER — Inpatient Hospital Stay (HOSPITAL_COMMUNITY): Admission: RE | Admit: 2017-05-20 | Payer: Medicaid Other | Source: Ambulatory Visit

## 2017-08-07 ENCOUNTER — Encounter (HOSPITAL_COMMUNITY): Payer: Self-pay

## 2018-03-05 ENCOUNTER — Emergency Department (HOSPITAL_COMMUNITY): Payer: Medicaid Other

## 2018-03-05 ENCOUNTER — Encounter (HOSPITAL_COMMUNITY): Payer: Self-pay | Admitting: Emergency Medicine

## 2018-03-05 ENCOUNTER — Emergency Department (HOSPITAL_COMMUNITY)
Admission: EM | Admit: 2018-03-05 | Discharge: 2018-03-05 | Disposition: A | Discharge status: Home / Self Care | Payer: Medicaid Other | Attending: Emergency Medicine | Admitting: Emergency Medicine

## 2018-03-05 ENCOUNTER — Other Ambulatory Visit: Payer: Self-pay

## 2018-03-05 DIAGNOSIS — Z5321 Procedure and treatment not carried out due to patient leaving prior to being seen by health care provider: Secondary | ICD-10-CM | POA: Insufficient documentation

## 2018-03-05 DIAGNOSIS — R062 Wheezing: Secondary | ICD-10-CM | POA: Insufficient documentation

## 2018-03-05 DIAGNOSIS — J45909 Unspecified asthma, uncomplicated: Secondary | ICD-10-CM | POA: Insufficient documentation

## 2018-03-05 MED ORDER — ALBUTEROL SULFATE (2.5 MG/3ML) 0.083% IN NEBU
5.0000 mg | INHALATION_SOLUTION | Freq: Once | RESPIRATORY_TRACT | Status: AC
  Administered 2018-03-05: 5 mg via RESPIRATORY_TRACT
  Filled 2018-03-05: qty 6

## 2018-03-05 NOTE — ED Triage Notes (Signed)
C/o asthma flare-up since last night with sob and wheezing.  Out of albuterol inhaler.  Speaking in complete sentences on arrival.

## 2018-03-05 NOTE — ED Notes (Signed)
Patient left after triage, encouraged patient to stay but declined

## 2018-03-07 NOTE — ED Notes (Signed)
Follow up call made  No answer  03/07/18 1105 s Mikel Hardgrove rn

## 2019-06-22 IMAGING — DX DG CHEST 2V
2 series · 2 of 2 positions shown · non-contrast
Comparison: None.

CLINICAL DATA: Shortness of breath.  Asthma.

EXAM:
CHEST - 2 VIEW

[chest pa]
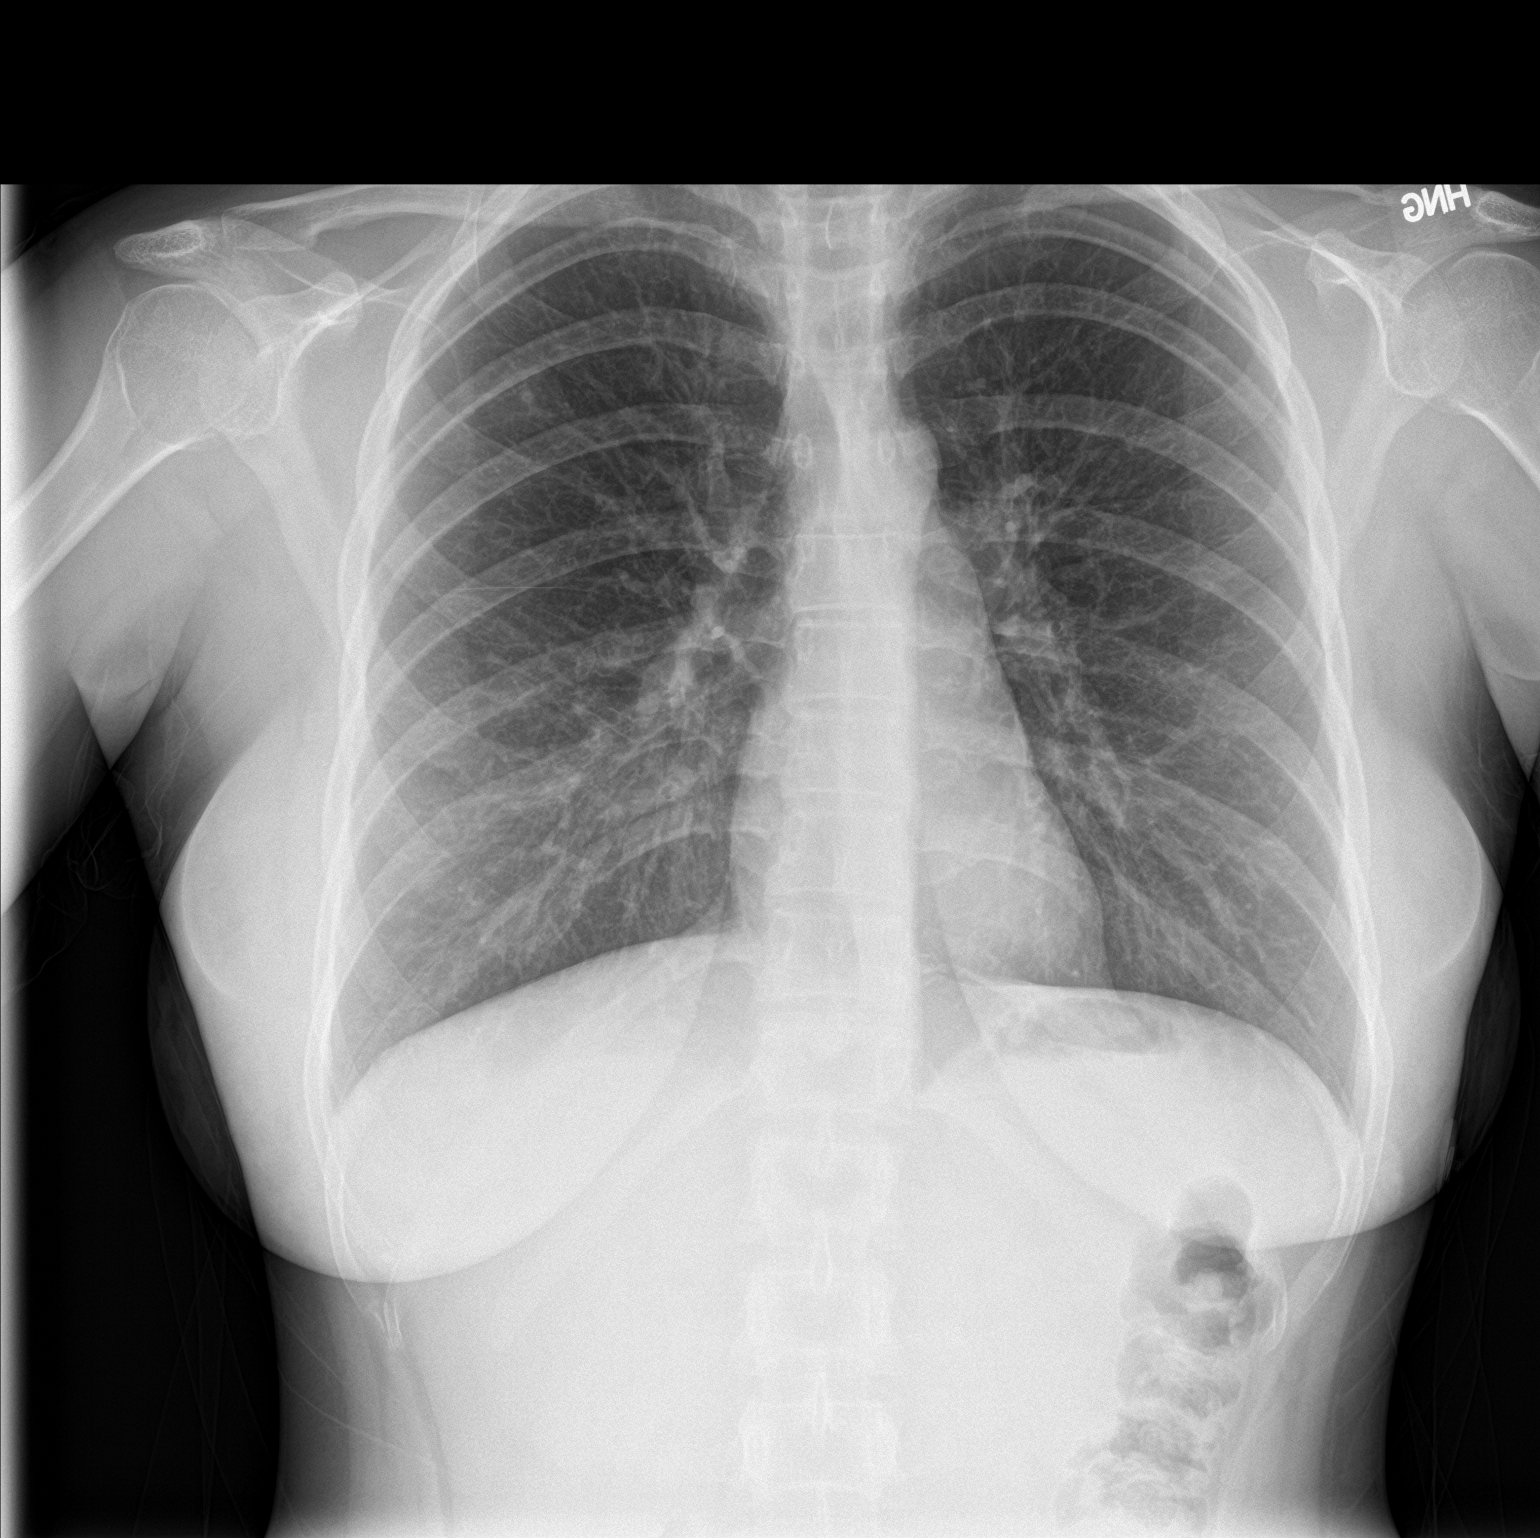

[chest lat]
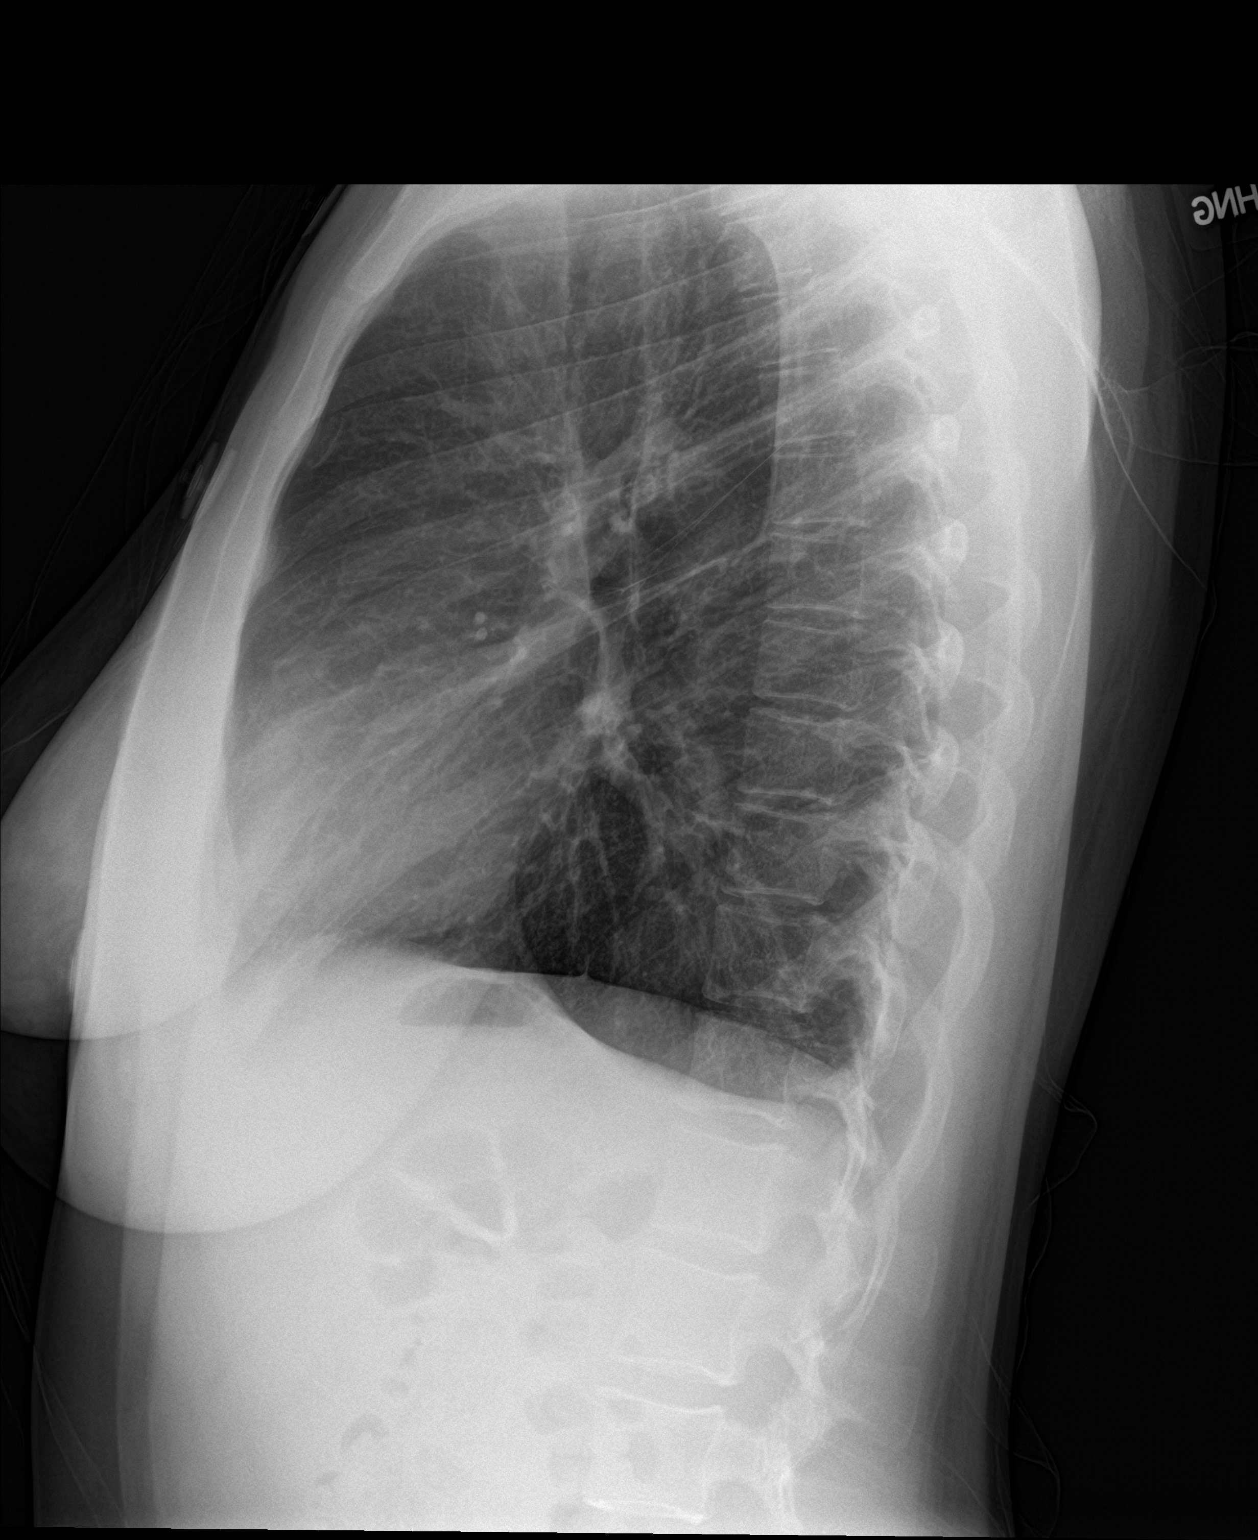

[2 of 2 positions shown; findings below may reference images not displayed]

FINDINGS: The cardiomediastinal contours are normal. Mild lower lobe bronchial
thickening. Borderline hyperinflation. Pulmonary vasculature is
normal. No consolidation, pleural effusion, or pneumothorax. No
acute osseous abnormalities are seen.
IMPRESSION: Borderline hyperinflation with mild lower lobe bronchial thickening
consistent with stated history of asthma.

## 2019-07-04 ENCOUNTER — Emergency Department (HOSPITAL_COMMUNITY): Payer: Self-pay

## 2019-07-04 ENCOUNTER — Emergency Department (HOSPITAL_COMMUNITY)
Admission: EM | Admit: 2019-07-04 | Discharge: 2019-07-04 | Disposition: A | Discharge status: Home / Self Care | Payer: Self-pay | Attending: Emergency Medicine | Admitting: Emergency Medicine

## 2019-07-04 ENCOUNTER — Other Ambulatory Visit: Payer: Self-pay

## 2019-07-04 DIAGNOSIS — J45901 Unspecified asthma with (acute) exacerbation: Secondary | ICD-10-CM | POA: Insufficient documentation

## 2019-07-04 MED ORDER — ALBUTEROL SULFATE HFA 108 (90 BASE) MCG/ACT IN AERS
2.0000 | INHALATION_SPRAY | Freq: Once | RESPIRATORY_TRACT | Status: AC
  Administered 2019-07-04: 2 via RESPIRATORY_TRACT
  Filled 2019-07-04: qty 6.7

## 2019-07-04 MED ORDER — BUDESONIDE-FORMOTEROL FUMARATE 160-4.5 MCG/ACT IN AERO: 2.0000 | INHALATION_SPRAY | Freq: Once | RESPIRATORY_TRACT | 12 refills | Status: DC

## 2019-07-04 MED ORDER — ALBUTEROL SULFATE HFA 108 (90 BASE) MCG/ACT IN AERS: 1.0000 | INHALATION_SPRAY | Freq: Four times a day (QID) | RESPIRATORY_TRACT | 12 refills | Status: DC | PRN

## 2019-07-04 NOTE — ED Notes (Signed)
DC instructions discussed and pt verbalized understanding. Pt home stable with steady gait with printed copuy of instructions and inhaler .

## 2019-07-04 NOTE — ED Triage Notes (Signed)
Pt reports current everyday smoker. States has had productive cough for the last 2 months. Pt reports using inhaler upto 7x/day. No acute distress noted.

## 2019-07-04 NOTE — Discharge Instructions (Signed)
Return if any problems.
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
## 2020-10-20 IMAGING — CR DG CHEST 2V
2 series · 2 of 2 positions shown · non-contrast
Comparison: 03/05/2018

CLINICAL DATA: Productive cough for 2 months.

EXAM:
CHEST - 2 VIEW

[chest pa]
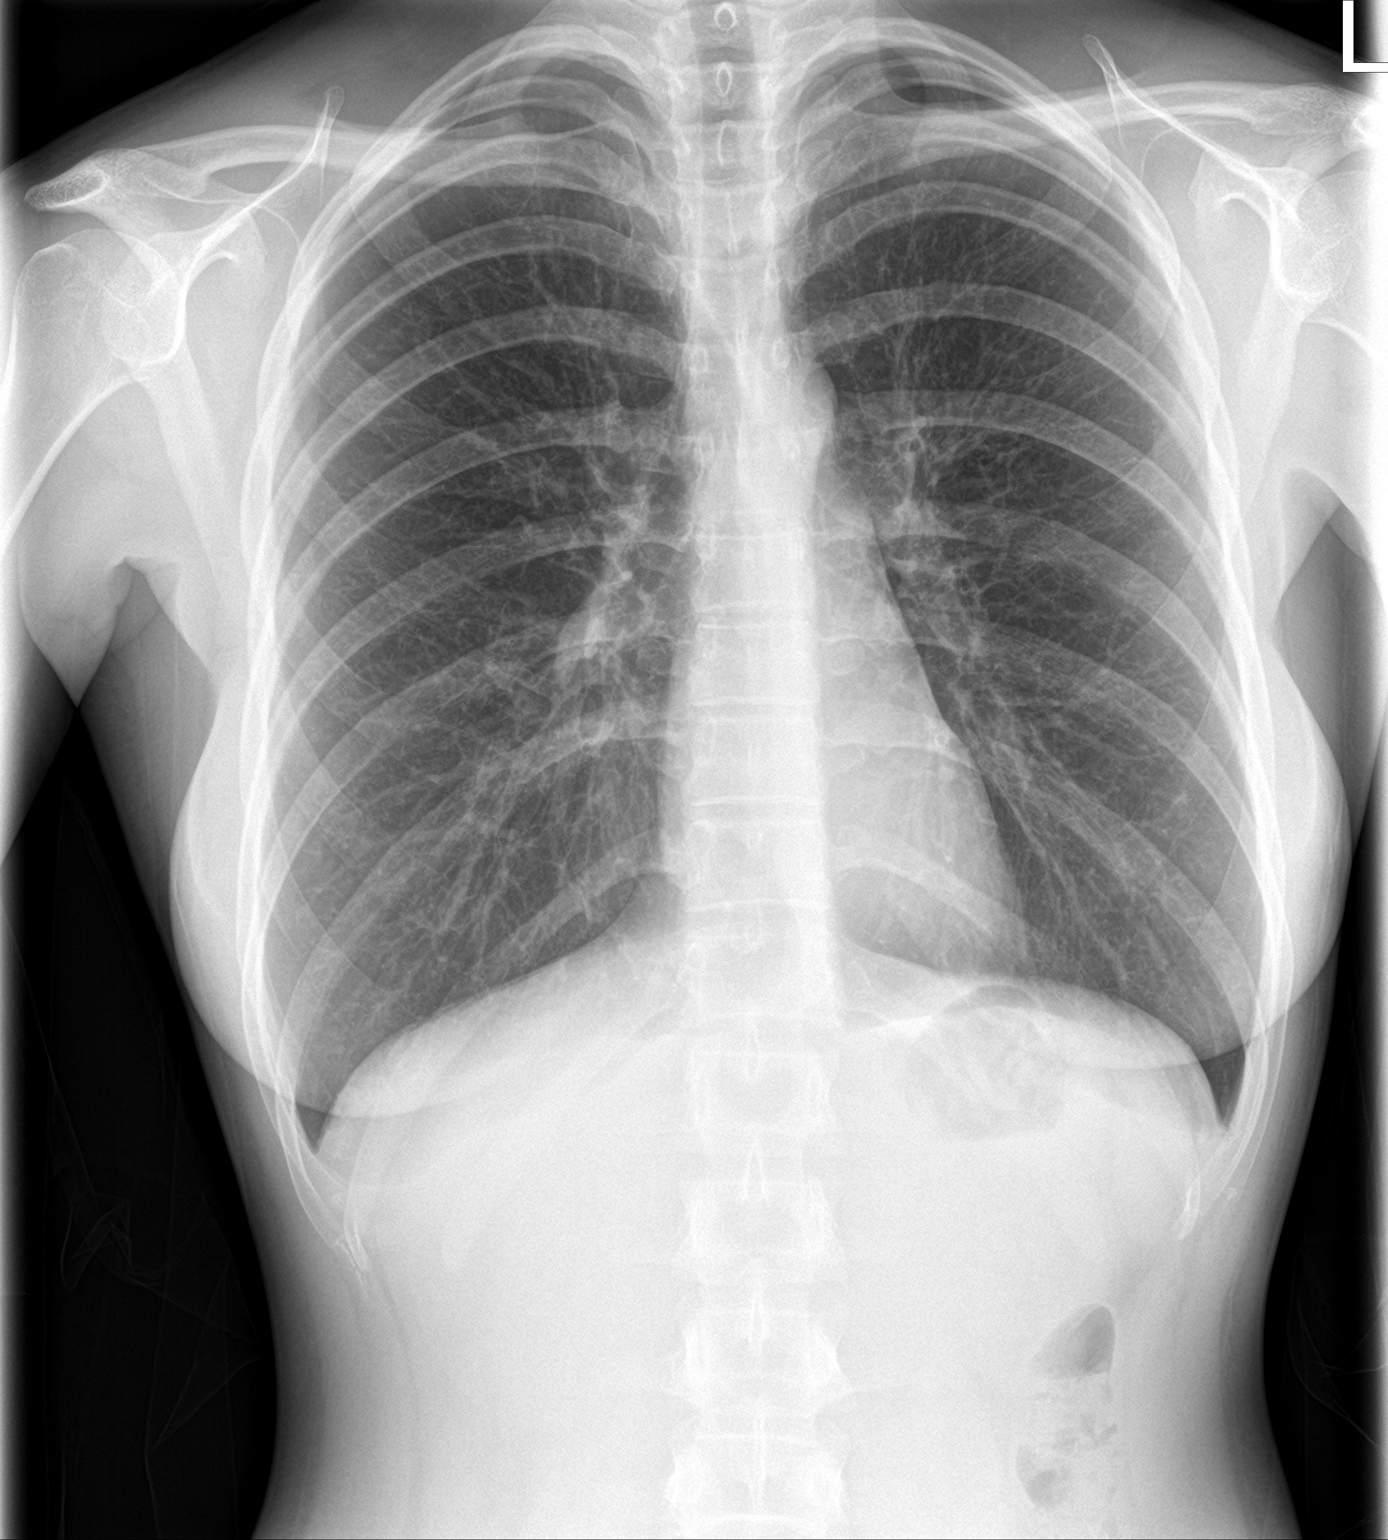

[chest lat]
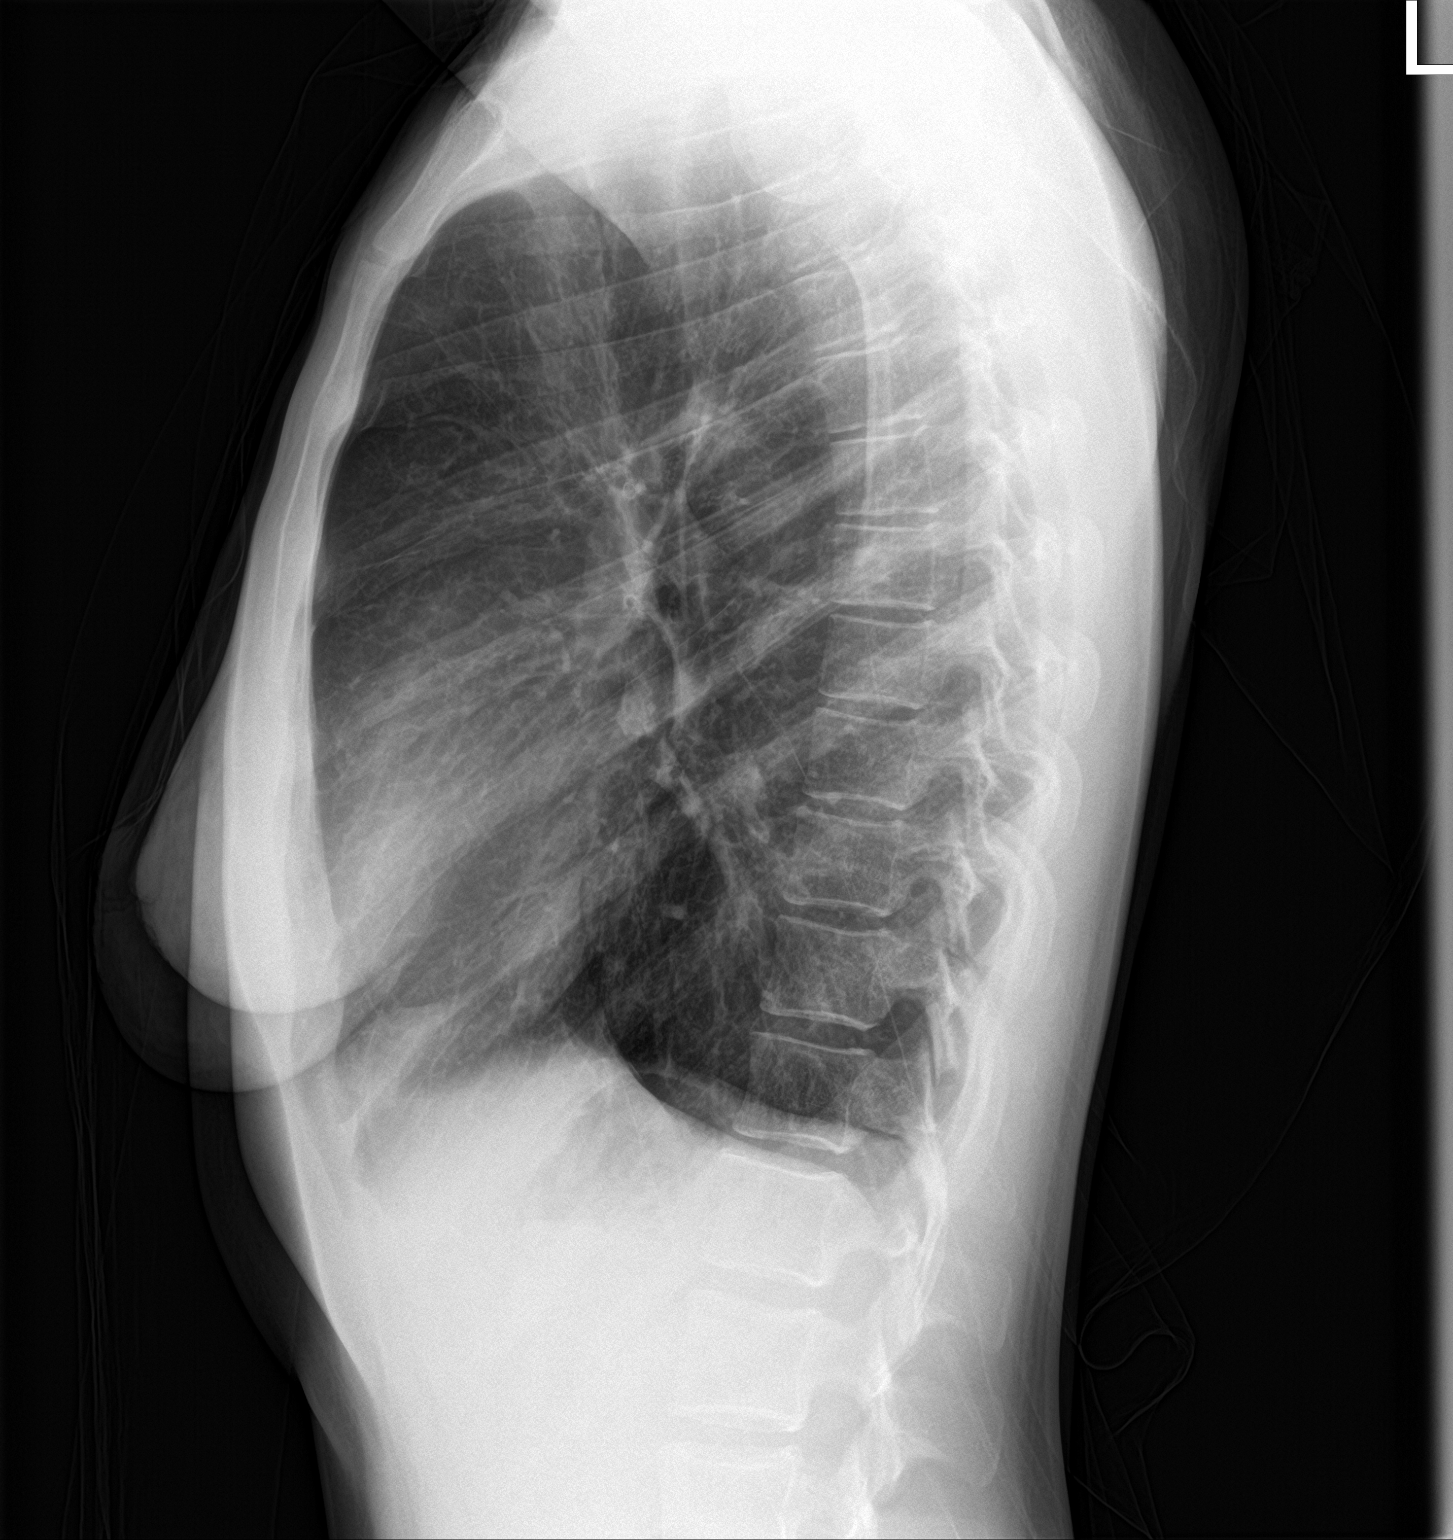

[2 of 2 positions shown; findings below may reference images not displayed]

FINDINGS: The heart size and mediastinal contours are within normal limits.
Both lungs are clear except for peribronchial thickening. No
effusions. The visualized skeletal structures are unremarkable.
IMPRESSION: Bronchitic changes.

## 2023-05-31 ENCOUNTER — Ambulatory Visit (INDEPENDENT_AMBULATORY_CARE_PROVIDER_SITE_OTHER): Payer: Self-pay | Admitting: Nurse Practitioner

## 2023-05-31 ENCOUNTER — Encounter: Payer: Self-pay | Admitting: Nurse Practitioner

## 2023-05-31 VITALS — BP 102/60 | HR 82 | Temp 98.3°F | Ht 68.0 in | Wt 154.4 lb

## 2023-05-31 DIAGNOSIS — Z113 Encounter for screening for infections with a predominantly sexual mode of transmission: Secondary | ICD-10-CM

## 2023-05-31 DIAGNOSIS — Z2821 Immunization not carried out because of patient refusal: Secondary | ICD-10-CM

## 2023-05-31 DIAGNOSIS — J4521 Mild intermittent asthma with (acute) exacerbation: Secondary | ICD-10-CM

## 2023-05-31 DIAGNOSIS — Z8709 Personal history of other diseases of the respiratory system: Secondary | ICD-10-CM | POA: Insufficient documentation

## 2023-05-31 DIAGNOSIS — Z7689 Persons encountering health services in other specified circumstances: Secondary | ICD-10-CM | POA: Insufficient documentation

## 2023-05-31 DIAGNOSIS — Z13228 Encounter for screening for other metabolic disorders: Secondary | ICD-10-CM

## 2023-05-31 DIAGNOSIS — Z1159 Encounter for screening for other viral diseases: Secondary | ICD-10-CM | POA: Insufficient documentation

## 2023-05-31 DIAGNOSIS — Z3A21 21 weeks gestation of pregnancy: Secondary | ICD-10-CM | POA: Insufficient documentation

## 2023-05-31 DIAGNOSIS — Z13220 Encounter for screening for lipoid disorders: Secondary | ICD-10-CM

## 2023-05-31 LAB — POCT URINALYSIS DIPSTICK
Bilirubin, UA: NEGATIVE
Blood, UA: NEGATIVE
Glucose, UA: NEGATIVE
Ketones, UA: NEGATIVE
Leukocytes, UA: NEGATIVE
Nitrite, UA: NEGATIVE
Protein, UA: NEGATIVE
Spec Grav, UA: 1.02 (ref 1.010–1.025)
Urobilinogen, UA: 1 U/dL
pH, UA: 7 (ref 5.0–8.0)

## 2023-05-31 MED ORDER — ALBUTEROL SULFATE HFA 108 (90 BASE) MCG/ACT IN AERS: 1.0000 | INHALATION_SPRAY | Freq: Four times a day (QID) | RESPIRATORY_TRACT | 6 refills | Status: DC | PRN

## 2023-05-31 NOTE — Assessment & Plan Note (Signed)
Will check Hepatitis C screening due to recent recommendations to screen all adults 18 years and older

## 2023-05-31 NOTE — Patient Instructions (Signed)
You will receive a call from an OB/GYN provider in the next few days for your referral

## 2023-05-31 NOTE — Assessment & Plan Note (Addendum)
She has not had any prenatal care, she is taking a prenatal vitamin. Will check baseline labs and refer to OB/GYN. She estimates being approximately [redacted] weeks pregnant. No current issues with her pregnancy. I have advised her she can apply for pregnancy medicaid as she awaits her insurance with her husband. She is advised if she has any problems to go to ER or [facility].  Urinalysis is negative for protein

## 2023-05-31 NOTE — Patient Outreach (Signed)
  Care Coordination   Documentation  Note   05/31/2023 Name: Maria Dudley MRN: [id number] DOB: Oct 14, 1995  Maria Dudley is a 27 y.o. year old female who sees Arnette Felts, FNP for primary care. I  collaborated with patients primary care provider who requested SW assistance with care coordination needs.  What matters to the patients health and wellness today?  SW did not speak with the patient today.   SDOH assessments and interventions completed:  No     Care Coordination Interventions:  Yes, provided   Interventions Today    Flowsheet Row Most Recent Value  Chronic Disease   Chronic disease during today's visit Other  [Lack on healthcare coverage, pregnancy]  General Interventions   General Interventions Discussed/Reviewed [employer], Communication with  [employer] placed to [employer] and Cathlyn Parsons with DSS satellite office requesting outreach to patient to assist with Medicaid application]  Communication with PCP/Specialists  [Contacted by PCP who requests assistance with referral for Medicaid. Patient gave verbal permission for SW to send demographic information to DSS contacts]        Follow up plan:  SW will outreach the patient over the next week to ensure contacted by DSS caseworker.    Encounter Outcome:  Patient Visit Completed   Maria Dudley, Kenard Gower, CDP Social Worker, Certified Dementia Practitioner [facility] Care Management  Care Coordination [phone]

## 2023-05-31 NOTE — Assessment & Plan Note (Signed)

## 2023-05-31 NOTE — Assessment & Plan Note (Signed)
She can take her albuterol inhaler as needed, will confirm if can take symbicort.

## 2023-05-31 NOTE — Progress Notes (Addendum)
Madelaine Bhat, CMA,acting as a Neurosurgeon for Arnette Felts, FNP.,have documented all relevant documentation on the behalf of Arnette Felts, FNP,as directed by  Arnette Felts, FNP while in the presence of Arnette Felts, FNP.  Subjective:  Patient ID: Maria Dudley , female    DOB: April 09, 1996 , 27 y.o.   MRN: [id number]  Chief Complaint  Patient presents with   Establish Care    HPI  Patient presents today to establish care, patient is currently around 21 weeks. Patient hasn't been to a GYN yet. Patient reports when she was pregnant the first time she went to the health department- patient has a 74 year old son. Patient takes [employer] prenatals. Patient is a stay at home mom. She has not had a PCP for at least 6 years ago. She is married.  PMH - asthma (albuterol/symbicort) FMH - mother - diabetes, sister - diabetes, 1 year remission from lung cancer. Maternal grandmother had cancer unsure of type and diabetes and grandfather - bone cancer (deceased) - diabetes. She does not know her fathers history  Patient reports she went to the pregnancy network for her first visit and an ultrasound, patient also went and got an ultrasound for the gender of the baby in August (boy). Patient reports she was told her due date would be January 20th. She has not had any prenatal care due to insurance, she will be getting insurance with her husband with [place]. She did have a spontaneuos abortion at 8 weeks with her first pregnancy.   BP Readings from Last 3 Encounters: 05/31/23 : 102/60 07/04/19 : 103/72 03/05/18 : 107/64   Reports her weight prior to pregnancy was 135 lbs.      Past Medical History:  Diagnosis Date   Asthma      Family History  Problem Relation Age of Onset   Obesity Mother    Diabetes Mother    Cancer Sister    Diabetes Sister    Cancer Maternal Grandfather      Current Outpatient Medications:    budesonide-formoterol (SYMBICORT) 160-4.5 MCG/ACT inhaler, Inhale 2 puffs  into the lungs once for 1 dose., Disp: 1 Inhaler, Rfl: 12   ibuprofen (ADVIL,MOTRIN) 600 MG tablet, Take 1 tablet (600 mg total) by mouth every 6 (six) hours., Disp: 30 tablet, Rfl: 0   Prenatal Vit-Fe Fumarate-FA (PRENATAL MULTIVITAMIN) TABS tablet, Take 2 tablets by mouth daily at 12 noon., Disp: , Rfl:    albuterol (VENTOLIN HFA) 108 (90 Base) MCG/ACT inhaler, Inhale 1-2 puffs into the lungs every 6 (six) hours as needed for wheezing or shortness of breath., Disp: 1 g, Rfl: 6   Allergies  Allergen Reactions   Bee Venom Anaphylaxis, Shortness Of Breath and Swelling     Review of Systems  Constitutional: Negative.   HENT: Negative.    Eyes: Negative.   Respiratory: Negative.    Cardiovascular: Negative.   Gastrointestinal: Negative.   Neurological: Negative.   Psychiatric/Behavioral: Negative.       Today's Vitals   05/31/23 1102  BP: 102/60  Pulse: 82  Temp: 98.3 F (36.8 C)  TempSrc: Oral  Weight: 154 lb 6.4 oz (70 kg)  Height: 5\' 8"  (1.727 m)   Body mass index is 23.48 kg/m.  Wt Readings from Last 3 Encounters:  05/31/23 154 lb 6.4 oz (70 kg)  07/04/19 135 lb (61.2 kg)  05/14/17 169 lb (76.7 kg)      Objective:  Physical Exam Vitals reviewed.  Constitutional:  General: She is not in acute distress.    Appearance: Normal appearance.  Cardiovascular:     Rate and Rhythm: Normal rate and regular rhythm.     Pulses: Normal pulses.     Heart sounds: Normal heart sounds. No murmur heard. Pulmonary:     Effort: Pulmonary effort is normal. No respiratory distress.     Breath sounds: Normal breath sounds.  Abdominal:     General: Abdomen is flat. Bowel sounds are normal. There is no distension.     Palpations: Abdomen is soft. There is no mass.     Tenderness: There is no abdominal tenderness.  Skin:    Capillary Refill: Capillary refill takes less than 2 seconds.  Neurological:     General: No focal deficit present.     Mental Status: She is alert and  oriented to person, place, and time.     Cranial Nerves: No cranial nerve deficit.     Motor: No weakness.  Psychiatric:        Mood and Affect: Mood normal.        Behavior: Behavior normal.        Thought Content: Thought content normal.        Judgment: Judgment normal.         Assessment And Plan:  Mild intermittent asthma with acute exacerbation Assessment & Plan: She can take her albuterol inhaler as needed, will confirm if can take symbicort.    Screening for STDs (sexually transmitted diseases) -     NuSwab Vaginitis Plus (VG+) -     HIV Antibody (routine testing w rflx) -     RPR -     HSV 1 and 2 Ab, IgG -     Hepatitis B surface antibody,qualitative  Influenza vaccination declined  Encounter for screening for lipid disorder  Encounter for screening for metabolic disorder -     Hemoglobin A1c  [redacted] weeks gestation of pregnancy Assessment & Plan: She has not had any prenatal care, she is taking a prenatal vitamin. Will check baseline labs and refer to OB/GYN. She estimates being approximately [redacted] weeks pregnant. No current issues with her pregnancy. I have advised her she can apply for pregnancy medicaid as she awaits her insurance with her husband. She is advised if she has any problems to go to ER or [facility].  Urinalysis is negative for protein  Orders: -     Beta hCG quant (ref lab) -     CBC with Differential/Platelet -     CMP14+EGFR -     TSH -     Ambulatory referral to Obstetrics / Gynecology -     POCT urinalysis dipstick  Encounter for hepatitis C screening test for low risk patient Assessment & Plan: Will check Hepatitis C screening due to recent recommendations to screen all adults 18 years and older    Orders: -     Hepatitis C antibody  Establishing care with new doctor, encounter for Assessment & Plan: Patient is here to establish care. Went over patient medical, family, social and surgical history. Reviewed with patient their  medications and any allergies  Reviewed with patient their sexual orientation, drug/tobacco and alcohol use Dicussed any new concerns with patient  recommended patient comes in for a physical exam and complete blood work.  Educated patient about the importance of annual screenings and immunizations.  Advised patient to eat a healthy diet along with exercise for atleast 30-45 min atleast 4-5 days of the  week.      Other orders -     Albuterol Sulfate HFA; Inhale 1-2 puffs into the lungs every 6 (six) hours as needed for wheezing or shortness of breath.  Dispense: 1 g; Refill: 6    Return for schedule HM in late March 2025.   Patient was given opportunity to ask questions. Patient verbalized understanding of the plan and was able to repeat key elements of the plan. All questions were answered to their satisfaction.    Jeanell Sparrow, FNP, have reviewed all documentation for this visit. The documentation on 06/17/23 for the exam, diagnosis, procedures, and orders are all accurate and complete.   IF YOU HAVE BEEN REFERRED TO A SPECIALIST, IT MAY TAKE 1-2 WEEKS TO SCHEDULE/PROCESS THE REFERRAL. IF YOU HAVE NOT HEARD FROM US/SPECIALIST IN TWO WEEKS, PLEASE GIVE [place] A CALL AT [phone].

## 2023-06-01 ENCOUNTER — Ambulatory Visit: Payer: Self-pay

## 2023-06-01 LAB — CMP14+EGFR
ALT: 34 IU/L — ABNORMAL HIGH (ref 0–32)
AST: 31 IU/L (ref 0–40)
Albumin: 3.8 g/dL — ABNORMAL LOW (ref 4.0–5.0)
Alkaline Phosphatase: 62 IU/L (ref 44–121)
BUN/Creatinine Ratio: 11 (ref 9–23)
BUN: 6 mg/dL (ref 6–20)
Bilirubin Total: 0.2 mg/dL (ref 0.0–1.2)
CO2: 16 mmol/L — ABNORMAL LOW (ref 20–29)
Calcium: 9.8 mg/dL (ref 8.7–10.2)
Chloride: 99 mmol/L (ref 96–106)
Creatinine, Ser: 0.53 mg/dL — ABNORMAL LOW (ref 0.57–1.00)
Globulin, Total: 3.1 g/dL (ref 1.5–4.5)
Glucose: 75 mg/dL (ref 70–99)
Potassium: 4.4 mmol/L (ref 3.5–5.2)
Sodium: 135 mmol/L (ref 134–144)
Total Protein: 6.9 g/dL (ref 6.0–8.5)
eGFR: 130 mL/min/{1.73_m2} (ref >59)

## 2023-06-01 LAB — RPR: RPR Ser Ql: NONREACTIVE

## 2023-06-01 LAB — CBC WITH DIFFERENTIAL/PLATELET
Basophils Absolute: 0.1 10*3/uL (ref 0.0–0.2)
Basos: 1 %
EOS (ABSOLUTE): 0.4 10*3/uL (ref 0.0–0.4)
Eos: 4 %
Hematocrit: 35.4 % (ref 34.0–46.6)
Hemoglobin: 11.8 g/dL (ref 11.1–15.9)
Immature Grans (Abs): 0 10*3/uL (ref 0.0–0.1)
Immature Granulocytes: 0 %
Lymphocytes Absolute: 2.6 10*3/uL (ref 0.7–3.1)
Lymphs: 24 %
MCH: 28.8 pg (ref 26.6–33.0)
MCHC: 33.3 g/dL (ref 31.5–35.7)
MCV: 86 fL (ref 79–97)
Monocytes Absolute: 0.7 10*3/uL (ref 0.1–0.9)
Monocytes: 6 %
Neutrophils Absolute: 7.3 10*3/uL — ABNORMAL HIGH (ref 1.4–7.0)
Neutrophils: 65 %
Platelets: 329 10*3/uL (ref 150–450)
RBC: 4.1 x10E6/uL (ref 3.77–5.28)
RDW: 17 % — ABNORMAL HIGH (ref 11.7–15.4)
WBC: 11.1 10*3/uL — ABNORMAL HIGH (ref 3.4–10.8)

## 2023-06-01 LAB — BETA HCG QUANT (REF LAB): hCG Quant: 10579 m[IU]/mL

## 2023-06-01 LAB — HEPATITIS C ANTIBODY: Hep C Virus Ab: NONREACTIVE

## 2023-06-01 LAB — HSV 1 AND 2 AB, IGG
HSV 1 Glycoprotein G Ab, IgG: <0.91 {index} (ref 0.00–0.90)
HSV 2 IgG, Type Spec: <0.91 {index} (ref 0.00–0.90)

## 2023-06-01 LAB — HEMOGLOBIN A1C
Est. average glucose Bld gHb Est-mCnc: 117 mg/dL
Hgb A1c MFr Bld: 5.7 % — ABNORMAL HIGH (ref 4.8–5.6)

## 2023-06-01 LAB — HEPATITIS B SURFACE ANTIBODY,QUALITATIVE: Hep B Surface Ab, Qual: REACTIVE

## 2023-06-01 LAB — HIV ANTIBODY (ROUTINE TESTING W REFLEX): HIV Screen 4th Generation wRfx: NONREACTIVE

## 2023-06-01 LAB — TSH: TSH: 0.859 u[IU]/mL (ref 0.450–4.500)

## 2023-06-01 NOTE — Patient Outreach (Signed)
  Care Coordination   Initial Visit Note   06/01/2023 Name: Maria Dudley MRN: [id number] DOB: December 17, 1995  Maria Dudley is a 27 y.o. year old female who sees Arnette Felts, FNP for primary care. I spoke with  Maria Dudley by phone today.  What matters to the patients health and wellness today?  Participate in prenatal care.    SDOH assessments and interventions completed:  No     Care Coordination Interventions:  Yes, provided   Interventions Today    Flowsheet Row Most Recent Value  Chronic Disease   Chronic disease during today's visit Other  [lack of health care, pregnancy]  General Interventions   General Interventions Discussed/Reviewed Communication with, General Interventions Discussed  [Discussed the patient is awaiting coverage from the [place],  is not eligible for Medicaid at this time. Needs to be seen by an OB for prenatal care]  Communication with PCP/Specialists  [Contacted [facility] who stated they are able to see the patient, provided contact number to the patient. Collaboration with primary care provider re: interventions and plan]        Follow up plan: No further intervention required. The patient will outreach [facility] to schedule an appointment.    Encounter Outcome:  Patient Visit Completed   Bevelyn Ngo, Kenard Gower, CDP Social Worker, Certified Dementia Practitioner [facility] Care Management  Care Coordination [phone]

## 2023-06-01 NOTE — Patient Instructions (Signed)
Visit Information  Thank you for taking time to visit with me today. Please don't hesitate to contact me if I can be of assistance to you.   Following are the goals we discussed today:  - Call the [facility] to schedule a new patient appointment at [phone]  If you are experiencing a Mental Health or Behavioral Health Crisis or need someone to talk to, please call 1-800-273-TALK (toll free, 24 hour hotline) go to [facility] [address] [place] [phone]) call 911  Patient verbalizes understanding of instructions and care plan provided today and agrees to view in MyChart. Active MyChart status and patient understanding of how to access instructions and care plan via MyChart confirmed with patient.     No further follow up required: Please contact me as needed  Bevelyn Ngo, BSW, CDP Social Worker, Certified Dementia Practitioner [facility] Care Management  Care Coordination [phone]

## 2023-06-03 LAB — NUSWAB VAGINITIS PLUS (VG+)
Candida albicans, NAA: NEGATIVE
Candida glabrata, NAA: NEGATIVE
Chlamydia trachomatis, NAA: NEGATIVE
Megasphaera 1: HIGH {score} — AB
Neisseria gonorrhoeae, NAA: NEGATIVE
Trich vag by NAA: NEGATIVE

## 2023-06-23 ENCOUNTER — Telehealth (INDEPENDENT_AMBULATORY_CARE_PROVIDER_SITE_OTHER): Payer: Self-pay | Admitting: *Deleted

## 2023-06-23 DIAGNOSIS — O0990 Supervision of high risk pregnancy, unspecified, unspecified trimester: Secondary | ICD-10-CM | POA: Insufficient documentation

## 2023-06-23 DIAGNOSIS — O0930 Supervision of pregnancy with insufficient antenatal care, unspecified trimester: Secondary | ICD-10-CM | POA: Insufficient documentation

## 2023-06-23 DIAGNOSIS — Z3492 Encounter for supervision of normal pregnancy, unspecified, second trimester: Secondary | ICD-10-CM

## 2023-06-23 DIAGNOSIS — Z3490 Encounter for supervision of normal pregnancy, unspecified, unspecified trimester: Secondary | ICD-10-CM | POA: Insufficient documentation

## 2023-06-23 DIAGNOSIS — Z62810 Personal history of physical and sexual abuse in childhood: Secondary | ICD-10-CM

## 2023-06-23 DIAGNOSIS — Z8709 Personal history of other diseases of the respiratory system: Secondary | ICD-10-CM

## 2023-06-23 DIAGNOSIS — Z3A24 24 weeks gestation of pregnancy: Secondary | ICD-10-CM
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
## 2023-06-23 NOTE — Patient Instructions (Signed)
Options for Doula Care in the [place]  As you review your birthing options, consider having a birth doula. A doula is trained to provide support before, during and just after you give birth. There are also postpartum doulas that help you adjust to new parenthood.  While doulas do not provide medical care, they do provide emotional, physical and educational support. A few months before your baby arrives, doulas can help answer questions, ease concerns and help you create and support your birthing plan.    Doulas can help reduce your stress and comfort you and your partner. They can help you cope with labor by helping you use breathing techniques, massage, creative labor positioning, essential oils and affirmations.   Studies show that the benefits of having a doula include:   A more positive birth experience  Fewer requests for pain-relief medication  Less likelihood of cesarean section, commonly called a c-section   Doulas are typically hired via a fee and contract between you and the doula. We are happy to provide a list of the most active doulas in the area, all of whom are credentialed by [facility] and will not count as a visitor at your birth.  There are several options for no-cost doula care at our hospital, including:  [facility] Volunteer Doula Program Every Baby [place] Doula Program A Cure 4 Moms Doula Study (available only at [facility], [facility], [place] and [place] [facility] offices)  For more information on these programs or to receive a list of doulas active in our area, please email [email or web address]         

## 2023-06-28 ENCOUNTER — Ambulatory Visit (INDEPENDENT_AMBULATORY_CARE_PROVIDER_SITE_OTHER): Payer: Self-pay | Admitting: Obstetrics and Gynecology

## 2023-06-28 ENCOUNTER — Other Ambulatory Visit: Payer: Self-pay

## 2023-06-28 ENCOUNTER — Other Ambulatory Visit (HOSPITAL_COMMUNITY)
Admission: RE | Admit: 2023-06-28 | Discharge: 2023-06-28 | Disposition: A | Discharge status: Home / Self Care | Payer: Self-pay | Source: Ambulatory Visit | Attending: Obstetrics and Gynecology | Admitting: Obstetrics and Gynecology

## 2023-06-28 ENCOUNTER — Encounter: Payer: Self-pay | Admitting: Obstetrics and Gynecology

## 2023-06-28 VITALS — BP 102/58 | HR 76 | Wt 158.7 lb

## 2023-06-28 DIAGNOSIS — Z62810 Personal history of physical and sexual abuse in childhood: Secondary | ICD-10-CM

## 2023-06-28 DIAGNOSIS — R7303 Prediabetes: Secondary | ICD-10-CM | POA: Insufficient documentation

## 2023-06-28 DIAGNOSIS — Z8709 Personal history of other diseases of the respiratory system: Secondary | ICD-10-CM

## 2023-06-28 DIAGNOSIS — O99012 Anemia complicating pregnancy, second trimester: Secondary | ICD-10-CM

## 2023-06-28 DIAGNOSIS — Z3492 Encounter for supervision of normal pregnancy, unspecified, second trimester: Secondary | ICD-10-CM | POA: Insufficient documentation

## 2023-06-28 DIAGNOSIS — Z3A25 25 weeks gestation of pregnancy: Secondary | ICD-10-CM

## 2023-06-28 DIAGNOSIS — O0932 Supervision of pregnancy with insufficient antenatal care, second trimester: Secondary | ICD-10-CM

## 2023-06-28 MED ORDER — PREPLUS 27-1 MG PO TABS
1.0000 | ORAL_TABLET | Freq: Every day | ORAL | 13 refills | Status: AC
Start: 2023-06-28 — End: ?

## 2023-06-28 NOTE — Progress Notes (Addendum)
INITIAL PRENATAL VISIT  Subjective:   Maria Dudley is being seen today for her first obstetrical visit.   She is at [redacted]w[redacted]d gestation by ultrasound.  Her obstetrical history is significant for  none . Relationship with FOB: spouse, living together. Patient does intend to breast feed. Pregnancy history fully reviewed.  Patient reports no complaints.  Indications for ASA therapy (per uptodate) One of the following: Previous pregnancy with preeclampsia, especially early onset and with an adverse outcome No Multifetal gestation No Chronic hypertension No Type 1 or 2 diabetes mellitus No Chronic kidney disease No Autoimmune disease (antiphospholipid syndrome, systemic lupus erythematosus) No  Two or more of the following: Nulliparity No Obesity (body mass index >30 kg/m2) No Family history of preeclampsia in mother or sister No Age >=35 years No Sociodemographic characteristics (African American race, low socioeconomic level) No Personal risk factors (eg, previous pregnancy with low birth weight or small for gestational age infant, previous adverse pregnancy outcome [eg, stillbirth], interval >10 years between pregnancies) No   Objective:    Obstetric History OB History  Gravida Para Term Preterm AB Living  3 1 1  0 1 1  SAB IAB Ectopic Multiple Live Births  1 0 0 0 1    # Outcome Date GA Lbr Len/2nd Weight Sex Type Anes PTL Lv  3 Current           2 Term 05/14/17 [redacted]w[redacted]d  7 lb 11 oz (3.487 kg) M Vag-Spont Other  LIV  1 SAB 2016 [redacted]w[redacted]d           Past Medical History:  Diagnosis Date   Asthma     Past Surgical History:  Procedure Laterality Date   NO PAST SURGERIES      Current Outpatient Medications on File Prior to Visit  Medication Sig Dispense Refill   albuterol (VENTOLIN HFA) 108 (90 Base) MCG/ACT inhaler Inhale 1-2 puffs into the lungs every 6 (six) hours as needed for wheezing or shortness of breath. 1 g 6   diphenhydrAMINE (BENADRYL) 25 mg capsule Take 25  mg by mouth every 6 (six) hours as needed.     Prenatal Vit-Fe Fumarate-FA (PRENATAL MULTIVITAMIN) TABS tablet Take 2 tablets by mouth daily at 12 noon.     budesonide-formoterol (SYMBICORT) 160-4.5 MCG/ACT inhaler Inhale 2 puffs into the lungs once for 1 dose. 1 Inhaler 12   ibuprofen (ADVIL,MOTRIN) 600 MG tablet Take 1 tablet (600 mg total) by mouth every 6 (six) hours. (Patient not taking: Reported on 06/28/2023) 30 tablet 0   No current facility-administered medications on file prior to visit.    Allergies  Allergen Reactions   Bee Venom Anaphylaxis, Shortness Of Breath and Swelling    Social History:  reports that she has never smoked. She has never used smokeless tobacco. She reports that she does not currently use alcohol. She reports that she does not use drugs.  Family History  Problem Relation Age of Onset   Obesity Mother    Diabetes Mother    Cancer Sister    Diabetes Sister    Cancer Maternal Grandfather     The following portions of the patient's history were reviewed and updated as appropriate: allergies, current medications, past family history, past medical history, past social history, past surgical history and problem list.  Review of Systems Review of Systems  All other systems reviewed and are negative.     Physical Exam:  BP (!) 102/58   Pulse 76   Wt 158 lb 11.2  oz (72 kg)   LMP 01/06/2023 (Approximate)   BMI 24.13 kg/m  CONSTITUTIONAL: Well-developed, well-nourished female in no acute distress.  HENT:  Normocephalic, atraumatic.   EYES: Conjunctivae normal. NECK: Normal range of motion SKIN: Skin is warm and dry MUSCULOSKELETAL: Normal range of motion NEUROLOGIC: Alert and oriented PSYCHIATRIC: Normal mood and affect. Normal behavior. Normal judgment and thought content. CARDIOVASCULAR: Normal heart rate noted RESPIRATORY:normal effort ABDOMEN: Soft PELVIC: deferred    Assessment:    Pregnancy: G3P1011  1. Encounter for supervision of  low-risk pregnancy in second trimester BP and FHR normal Initial labs drawn. Prenatal vitamins. Problem list reviewed and updated.  - GC/Chlamydia probe amp ([place])not at [facility] - CBC/D/Plt+RPR+Rh+ABO+RubIgG... - PANORAMA PRENATAL TEST - HORIZON Basic Panel -urine culture Reviewed in detail the nature of the practice with collaborative care between  Genetic screening discussed: NIPS/First trimester screen/Quad/AFP ordered. Role of ultrasound in pregnancy discussed; Anatomy US: ordered.  2. Late prenatal care affecting pregnancy in second trimester Was seen by pregnancy network and received ultrasound   3. History of asthma Has inhaler prn  5. [redacted] weeks gestation of pregnancy Interested in waterbirth, discussed low risk pregnancy, waterbirth class, and visit with waterbirth provider. Information given for class and other child birth classes. Encouraged to send certificate after completion, will schedule w/ midwife  6. Prediabetes hgbA1C 9/9 5.7 Discussed 2hrGTT  Return in 1 week for 2hrGTT and 3 weeks for routine prenatal with midwife  Future Appointments  Date Time Provider Department Center  07/06/2023  8:20 AM [facility] LAB [facility]  07/21/2023  8:15 AM Bernerd Limbo, CNM [facility]  07/27/2023  9:15 AM [facility] NURSE [facility]  07/27/2023  9:30 AM [facility] US1 [facility]  12/13/2023 10:00 AM Arnette Felts, FNP [facility] None    Sue Lush, FNP

## 2023-06-28 NOTE — Patient Instructions (Addendum)
Considering Waterbirth? Guide for patients at Center for [employer] [facility]) Why consider waterbirth? Gentle birth for babies  Less pain medicine used in labor  May allow for passive descent/less pushing  May reduce perineal tears  More mobility and instinctive maternal position changes  Increased maternal relaxation   Is waterbirth safe? What are the risks of infection, drowning or other complications? Infection:  Very low risk (3.7 % for tub vs 4.8% for bed)  7 in 8000 waterbirths with documented infection  Poorly cleaned equipment most common cause  Slightly lower group B strep transmission rate  Drowning  Maternal:  Very low risk  Related to seizures or fainting  Newborn:  Very low risk. No evidence of increased risk of respiratory problems in multiple large studies  Physiological protection from breathing under water  Avoid underwater birth if there are any fetal complications  Once baby's head is out of the water, keep it out.  Birth complication  Some reports of cord trauma, but risk decreased by bringing baby to surface gradually  No evidence of increased risk of shoulder dystocia. Mothers can usually change positions faster in water than in a bed, possibly aiding the maneuvers to free the shoulder.   There are 2 things you MUST do to have a waterbirth with [facility]: Attend a waterbirth class at [place] at [facility]   3rd Wednesday of every month from 7-9 pm (virtual during [place]) [employer] at [email or web address] or [email or web address] or by calling [phone] Bring [place] the certificate from the class to your prenatal appointment or send via [place] with a midwife at 36 weeks* to see if you can still plan a waterbirth and to sign the consent.   *We also recommend that you schedule as many of your prenatal visits with a midwife as possible.    Helpful information: You may want to bring a bathing suit top to the hospital  to wear during labor but this is optional.  All other supplies are provided by the hospital. Please arrive at the hospital with signs of active labor, and do not wait at home until late in labor. It takes 45 min- 1 hour for fetal monitoring, and check in to your room to take place, plus transport and filling of the waterbirth tub.    Things that would prevent you from having a waterbirth: Premature, <37wks  Previous cesarean birth  Presence of thick meconium-stained fluid  Multiple gestation (Twins, triplets, etc.)  Uncontrolled diabetes or gestational diabetes requiring medication  Hypertension diagnosed in pregnancy or preexisting hypertension (gestational hypertension, preeclampsia, or chronic hypertension) Fetal growth restriction (your baby measures less than 10th percentile on ultrasound) Heavy vaginal bleeding  Non-reassuring fetal heart rate  Active infection (MRSA, etc.). Group B Strep is NOT a contraindication for waterbirth.  If your labor has to be induced and induction method requires continuous monitoring of the baby's heart rate  Other risks/issues identified by your obstetrical provider   Please remember that birth is unpredictable. Under certain unforeseeable circumstances your provider may advise against giving birth in the tub. These decisions will be made on a case-by-case basis and with the safety of you and your baby as our highest priority.    Updated 12/24/21  We highly recommend childbirth education to help you plan for labor and begin practicing coping skills (which will be needed with or without pain meds).  [place] Childbirth Education Options: Sign up by visiting [email or web address]  Childbirth ~ Self-Paced eClass (English  and Spanish) This online class offers you the freedom to complete a childbirth education series in the comfort of your own home at your own pace.  Childbirth Class (In-Person 4-Week Series  or on Saturdays, Virtual 4-Week Series ~  [place]) This interactive in-person class series will help you and your partner prepare for your birth experience. Topics include: Labor & Birth, Comfort Measures, Breathing Techniques, Massage, Medical Interventions, Pain Management Options, Cesarean Birth, Postpartum Care, and Newborn Care  Comfort Techniques for Labor ~ In-Person Class [facility]) This interactive class is designed for parents-to-be who want to learn & practice hands-on skills to help relieve some of the discomfort of labor and encourage their babies to rotate toward the best position for birth. Moms and their partners will be able to try a variety of labor positions with birth balls and rebozos as well as practice breathing, relaxation, and visualization techniques.  Natural Childbirth Class (In-Person 5-Week Series, In-Person on Saturdays or Virtual 5-Week Series ~ [place]) This class series is designed for expectant parents who want to learn and practice natural methods of coping with the process of labor and childbirth.  Cesarean Birth Self-Paced eClass (English and Spanish) This online course provides comprehensive information you can trust as you prepare for a possible cesarean birth. In this class, you'll learn how to make your birth and recovery comfortable and joyful through instructive video clips, animations, and activities.  Waterbirth ~ Architect Interested in a waterbirth? In addition to a consultation with your credentialed waterbirth provider, this free, informational online class will help you discover whether waterbirth is the right fit for you. Not all obstetrical practices offer waterbirth, so check with your healthcare provider.  Tour Scientist, research (physical sciences)) - Women's and [place] our 4 minute video tour of [place] located in [place].   [place] Parenting Education Options:  Pregnancy 101 (Virtual) Congratulations on your pregnancy!  This class is geared toward moms in their first trimester, but everyone is welcome. We are excited to guide you through all aspects of supporting a healthy pregnancy. You will learn what to expect at routine prenatal care appointments, common postpartum adjustments, basic infant safety, and breastfeeding.  Successful Partnering & Parenting ~ [place] Workshop [facility]) This workshop inspires and equips partners of all economic levels, ages, and cultures to confidently care for their infants, support the birthing persons, and navigate their own transformations into new partners and parents. Learning activities are geared towards supporting partner, but moms are welcome to attend.  'Baby & Me' Parenting Group (Virtual on Wednesdays at 11am) Enjoy this time discussing newborn & infant parenting topics and family adjustment issues with other new parents in a relaxed environment. Each week brings a new speaker or baby-centered activity. This group offers support and connection to parents as they journey through the adjustments and struggles of that sometimes overwhelming first year after the birth of a child.  Baby Safety, CPR, & Choking Class ~ Virtual This life-saving information is meant to encourage parents as they learn important safety and prevention tips as well as infant CPR and relief of choking.  Breastfeeding Class (In-Person in [place] or [employer]) Families learn what to expect in the first days and weeks of breastfeeding your newborn. IF YOU ARE AN EMPLOYEE TAKING THIS CLASS FOR CREDIT, DO NOT register yourself. Please e-mail [email or web address].   Breastfeeding Self-Paced eClass (English & Spanish) Families learn what to expect in the first days and weeks of breastfeeding your newborn.  Caring for Baby ~ In-Person, Virtual or Self-Paced Class This in-person class is for both expectant and adoptive parents who want to learn and practice the most up-to-date newborn care for their  babies. Focus is on birth through the first six weeks of life.  CPR & Choking Relief for Infants & Children ~ In-Person Class [facility]) This in-person course is designed for any parent, expectant parent, or adult who cares for infants or children. Participants learn and demonstrate cardiopulmonary resuscitation and choking relief procedures for both infants and children.  Grandparent Love ~ In-Person Class Grandparents will learn the most updated infant care and safety recommendations. They will discover ways to support their own children during the transition into the parenting role and receive tips on communicating with the new parents.  [place] Parenting Support Group Options:  Bereavement Grief Support Group (Pregnancy/Infant Loss) - Virtual This is an ongoing experience that meets once a month and is designed to help you honor the past, assist you in discovering tools to strengthen you today, and aid you in developing hope for the future.  Breastfeeding & Pumping Support Group (In-Person on Thursdays at 12pm or Virtual on Tuesdays at [place]) Join [place] in-person each Thursday starting June 1st, 2023 at 12pm! This support group is free for all families looking for breastfeeding and/or pumping support.   Community-Based Childbirth Education Options:  [facility] Department Classes:  Childbirth education classes can help you get ready for a positive parenting experience. You can also meet other expectant parents and get free stuff for your baby. Each class runs for five weeks on the same night and costs $45 for the mother-to-be and her support person. Medicaid covers the cost if you are eligible. Call [phone] to register.  [facility] offers a variety of programs for the [employer] and is another great way to get connected. Please go to [email or web address] for more information.  Childbirth With A Twist! Be informed of your options, get  educated on birth, understand what your body is doing, learn how to cope, and have a lot of fun and laughs all while doing it either from the comfort of your couch OR in our cozy office and classroom space near the [place] airport. If you are taking a virtual class, then class is taught LIVE, so you can ask questions and receive answers in real-time from an experienced doula and childbirth educator.  This virtual childbirth education class will meet for five instruction times online.  Although we are based in [place], [place], this virtual class is open to anyone in the world. Please visit: [email or web address] for more information.  Books We Love: The [place] Guide to Childbirth by Corinna Lines and Reola Calkins The First-Time Parent's Childbirth Handbook by Dr. Edmonia James, CNM The Birth Partner by Antionette Fairy

## 2023-06-29 LAB — HEMOGLOBIN A1C
Est. average glucose Bld gHb Est-mCnc: 111 mg/dL
Hgb A1c MFr Bld: 5.5 % (ref 4.8–5.6)

## 2023-06-29 LAB — CBC/D/PLT+RPR+RH+ABO+RUBIGG...
Antibody Screen: NEGATIVE
Basophils Absolute: 0.1 10*3/uL (ref 0.0–0.2)
Basos: 1 %
EOS (ABSOLUTE): 0.3 10*3/uL (ref 0.0–0.4)
Eos: 3 %
HCV Ab: NONREACTIVE
HIV Screen 4th Generation wRfx: NONREACTIVE
Hematocrit: 33 % — ABNORMAL LOW (ref 34.0–46.6)
Hemoglobin: 10.7 g/dL — ABNORMAL LOW (ref 11.1–15.9)
Hepatitis B Surface Ag: NEGATIVE
Immature Grans (Abs): 0 10*3/uL (ref 0.0–0.1)
Immature Granulocytes: 0 %
Lymphocytes Absolute: 2.8 10*3/uL (ref 0.7–3.1)
Lymphs: 27 %
MCH: 29 pg (ref 26.6–33.0)
MCHC: 32.4 g/dL (ref 31.5–35.7)
MCV: 89 fL (ref 79–97)
Monocytes Absolute: 0.6 10*3/uL (ref 0.1–0.9)
Monocytes: 6 %
Neutrophils Absolute: 6.5 10*3/uL (ref 1.4–7.0)
Neutrophils: 63 %
Platelets: 301 10*3/uL (ref 150–450)
RBC: 3.69 x10E6/uL — ABNORMAL LOW (ref 3.77–5.28)
RDW: 14.4 % (ref 11.7–15.4)
RPR Ser Ql: NONREACTIVE
Rh Factor: POSITIVE
Rubella Antibodies, IGG: 2.41 {index} (ref >0.99)
WBC: 10.3 10*3/uL (ref 3.4–10.8)

## 2023-06-29 LAB — HCV INTERPRETATION

## 2023-06-29 LAB — GC/CHLAMYDIA PROBE AMP (~~LOC~~) NOT AT ARMC
Chlamydia: NEGATIVE
Comment: NEGATIVE
Comment: NORMAL
Neisseria Gonorrhea: NEGATIVE

## 2023-06-29 MED ORDER — FERROUS GLUCONATE 324 (38 FE) MG PO TABS
324.0000 mg | ORAL_TABLET | Freq: Every day | ORAL | 3 refills | Status: DC
Start: 2023-06-29 — End: 2023-11-29

## 2023-06-29 NOTE — Addendum Note (Signed)
Addended by: Sue Lush on: 06/29/2023 01:45 PM   Modules accepted: Orders

## 2023-06-30 LAB — CULTURE, OB URINE

## 2023-06-30 LAB — URINE CULTURE, OB REFLEX

## 2023-07-04 LAB — PANORAMA PRENATAL TEST FULL PANEL:PANORAMA TEST PLUS 5 ADDITIONAL MICRODELETIONS: FETAL FRACTION: 12

## 2023-07-05 ENCOUNTER — Other Ambulatory Visit: Payer: Self-pay | Admitting: *Deleted

## 2023-07-05 DIAGNOSIS — Z3490 Encounter for supervision of normal pregnancy, unspecified, unspecified trimester: Secondary | ICD-10-CM

## 2023-07-05 LAB — HORIZON CUSTOM: REPORT SUMMARY: NEGATIVE

## 2023-07-06 ENCOUNTER — Other Ambulatory Visit: Payer: Self-pay

## 2023-07-06 DIAGNOSIS — Z3490 Encounter for supervision of normal pregnancy, unspecified, unspecified trimester: Secondary | ICD-10-CM

## 2023-07-07 LAB — CBC
Hematocrit: 31.2 % — ABNORMAL LOW (ref 34.0–46.6)
Hemoglobin: 10.2 g/dL — ABNORMAL LOW (ref 11.1–15.9)
MCH: 29.2 pg (ref 26.6–33.0)
MCHC: 32.7 g/dL (ref 31.5–35.7)
MCV: 89 fL (ref 79–97)
Platelets: 311 10*3/uL (ref 150–450)
RBC: 3.49 x10E6/uL — ABNORMAL LOW (ref 3.77–5.28)
RDW: 14.1 % (ref 11.7–15.4)
WBC: 9.6 10*3/uL (ref 3.4–10.8)

## 2023-07-07 LAB — HIV ANTIBODY (ROUTINE TESTING W REFLEX): HIV Screen 4th Generation wRfx: NONREACTIVE

## 2023-07-07 LAB — RPR: RPR Ser Ql: NONREACTIVE

## 2023-07-07 LAB — GLUCOSE TOLERANCE, 2 HOURS W/ 1HR
Glucose, 1 hour: 121 mg/dL (ref 70–179)
Glucose, 2 hour: 115 mg/dL (ref 70–152)
Glucose, Fasting: 92 mg/dL — ABNORMAL HIGH (ref 70–91)

## 2023-07-09 ENCOUNTER — Encounter: Payer: Self-pay | Admitting: Family Medicine

## 2023-07-09 ENCOUNTER — Other Ambulatory Visit: Payer: Self-pay | Admitting: Family Medicine

## 2023-07-09 DIAGNOSIS — O24419 Gestational diabetes mellitus in pregnancy, unspecified control: Secondary | ICD-10-CM | POA: Insufficient documentation

## 2023-07-09 DIAGNOSIS — O99013 Anemia complicating pregnancy, third trimester: Secondary | ICD-10-CM | POA: Insufficient documentation

## 2023-07-14 ENCOUNTER — Telehealth: Payer: Self-pay | Admitting: *Deleted

## 2023-07-14 DIAGNOSIS — O24419 Gestational diabetes mellitus in pregnancy, unspecified control: Secondary | ICD-10-CM

## 2023-07-14 NOTE — Telephone Encounter (Signed)
I called patient and informed her of results and recommendations per Dr. Alvester Morin. She agreed to appointment on 10/29 with educator. She has applied for [employer] and gotten notification it went thru. I explained I am not sure which supplies they cover but educator will help [place] determine and order. She voices understanding. Maria Dudley

## 2023-07-14 NOTE — Telephone Encounter (Signed)
-----   Message from Maria Dudley sent at 07/09/2023 12:44 PM EDT ----- Labs shows GDM. Please call patient to discuss and order supplies/DM education \  Mild anemia

## 2023-07-20 ENCOUNTER — Encounter: Payer: Self-pay | Attending: Family Medicine | Admitting: Dietician

## 2023-07-20 ENCOUNTER — Other Ambulatory Visit: Payer: Self-pay

## 2023-07-20 ENCOUNTER — Ambulatory Visit (INDEPENDENT_AMBULATORY_CARE_PROVIDER_SITE_OTHER): Payer: Self-pay | Admitting: Dietician

## 2023-07-20 DIAGNOSIS — Z3A28 28 weeks gestation of pregnancy: Secondary | ICD-10-CM | POA: Insufficient documentation

## 2023-07-20 DIAGNOSIS — O24419 Gestational diabetes mellitus in pregnancy, unspecified control: Secondary | ICD-10-CM | POA: Insufficient documentation

## 2023-07-20 DIAGNOSIS — Z713 Dietary counseling and surveillance: Secondary | ICD-10-CM | POA: Insufficient documentation

## 2023-07-20 NOTE — Progress Notes (Signed)
Patient was seen for Gestational Diabetes self-management on 07/20/2023  Start time 0925 and End time 1005   Estimated due date: 10/11/2023; [redacted]w[redacted]d   Clinical: Medications: prenatal vitamin, iron Medical History: GDM Labs: OGTT A1c 5.5% 06/28/2023 decreased from 5.7% 05/31/2023  Dietary and Lifestyle History: Patient lives with her husband.  She is not employed.  Physical Activity: walks at least 30 minutes daily Stress: none Sleep: 7-8 hours good  24 hr Recall:  First Meal: McDonald's sausage McMuffin OR ham and cheese sandwich at home Snack:  banana or grapes, cheese OR carrots with ranch Second meal:  none Snack:  frosted flakes cereal with whole milk and Ice Cream Third meal:  baked potato with bacon, cheese Snack:  sandwich OR carrots OR fruit and cheese Beverages:  water, regular sprite or gingerale, whole milk  NUTRITION INTERVENTION  Nutrition education (E-1) on the following topics:   Initial Follow-up  [x]  []  Definition of Gestational Diabetes [x]  []  Why dietary management is important in controlling blood glucose [x]  []  Effects each nutrient has on blood glucose levels [x]  []  Simple carbohydrates vs complex carbohydrates [x]  []  Fluid intake [x]  []  Creating a balanced meal plan []  []  Carbohydrate counting  [x]  []  When to check blood glucose levels [x]  []  Proper blood glucose monitoring techniques [x]  []  Effect of stress and stress reduction techniques  [x]  []  Exercise effect on blood glucose levels, appropriate exercise during pregnancy [x]  []  Importance of limiting caffeine and abstaining from alcohol and smoking [x]  []  Medications used for blood sugar control during pregnancy [x]  []  Hypoglycemia and rule of 15 [x]  []  Postpartum self care  Blood glucose monitor given: [employer] Auto Code Meter Lot # [id number] Strip Lot #[id number] C-2 Exp: 09/28/2024 CBG: 82 mg/dL fasting  Patient instructed to monitor glucose levels: FBS: 60 - <= 95 mg/dL; 2 hour: <= 742  mg/dL  Patient received handouts: Nutrition Diabetes and Pregnancy Carbohydrate Counting List Blood glucose log Snack ideas for diabetes during pregnancy  Patient will be seen for follow-up as needed.

## 2023-07-21 ENCOUNTER — Encounter: Payer: Self-pay | Admitting: Certified Nurse Midwife

## 2023-07-23 ENCOUNTER — Ambulatory Visit: Payer: Self-pay

## 2023-07-23 ENCOUNTER — Other Ambulatory Visit: Payer: Self-pay

## 2023-07-27 ENCOUNTER — Encounter: Payer: Self-pay | Admitting: Obstetrics and Gynecology

## 2023-07-27 ENCOUNTER — Other Ambulatory Visit: Payer: Self-pay | Admitting: *Deleted

## 2023-07-27 ENCOUNTER — Ambulatory Visit: Payer: Self-pay | Attending: Obstetrics and Gynecology | Admitting: *Deleted

## 2023-07-27 ENCOUNTER — Ambulatory Visit (HOSPITAL_BASED_OUTPATIENT_CLINIC_OR_DEPARTMENT_OTHER): Payer: Self-pay

## 2023-07-27 ENCOUNTER — Other Ambulatory Visit: Payer: Self-pay

## 2023-07-27 VITALS — BP 106/53 | HR 82

## 2023-07-27 DIAGNOSIS — O43193 Other malformation of placenta, third trimester: Secondary | ICD-10-CM | POA: Insufficient documentation

## 2023-07-27 DIAGNOSIS — Z5971 Insufficient health insurance coverage: Secondary | ICD-10-CM | POA: Insufficient documentation

## 2023-07-27 DIAGNOSIS — Z3493 Encounter for supervision of normal pregnancy, unspecified, third trimester: Secondary | ICD-10-CM

## 2023-07-27 DIAGNOSIS — Z3A28 28 weeks gestation of pregnancy: Secondary | ICD-10-CM | POA: Insufficient documentation

## 2023-07-27 DIAGNOSIS — O24419 Gestational diabetes mellitus in pregnancy, unspecified control: Secondary | ICD-10-CM

## 2023-07-27 DIAGNOSIS — O0930 Supervision of pregnancy with insufficient antenatal care, unspecified trimester: Secondary | ICD-10-CM

## 2023-07-27 DIAGNOSIS — O99013 Anemia complicating pregnancy, third trimester: Secondary | ICD-10-CM

## 2023-07-27 DIAGNOSIS — O0933 Supervision of pregnancy with insufficient antenatal care, third trimester: Secondary | ICD-10-CM | POA: Insufficient documentation

## 2023-07-27 DIAGNOSIS — Z3490 Encounter for supervision of normal pregnancy, unspecified, unspecified trimester: Secondary | ICD-10-CM

## 2023-07-27 DIAGNOSIS — Z363 Encounter for antenatal screening for malformations: Secondary | ICD-10-CM | POA: Insufficient documentation

## 2023-07-27 DIAGNOSIS — O24410 Gestational diabetes mellitus in pregnancy, diet controlled: Secondary | ICD-10-CM | POA: Insufficient documentation

## 2023-07-27 DIAGNOSIS — O43199 Other malformation of placenta, unspecified trimester: Secondary | ICD-10-CM | POA: Insufficient documentation

## 2023-07-28 ENCOUNTER — Encounter: Payer: Self-pay | Admitting: Family Medicine

## 2023-07-28 ENCOUNTER — Encounter: Payer: Self-pay | Admitting: *Deleted

## 2023-08-11 ENCOUNTER — Ambulatory Visit (INDEPENDENT_AMBULATORY_CARE_PROVIDER_SITE_OTHER): Admitting: Advanced Practice Midwife

## 2023-08-11 ENCOUNTER — Encounter: Payer: Self-pay | Admitting: *Deleted

## 2023-08-11 ENCOUNTER — Encounter: Payer: Self-pay | Admitting: Advanced Practice Midwife

## 2023-08-11 ENCOUNTER — Other Ambulatory Visit: Payer: Self-pay

## 2023-08-11 VITALS — BP 111/70 | HR 89 | Wt 164.0 lb

## 2023-08-11 DIAGNOSIS — Z1332 Encounter for screening for maternal depression: Secondary | ICD-10-CM

## 2023-08-11 DIAGNOSIS — Z23 Encounter for immunization: Secondary | ICD-10-CM | POA: Diagnosis not present

## 2023-08-11 DIAGNOSIS — O99013 Anemia complicating pregnancy, third trimester: Secondary | ICD-10-CM

## 2023-08-11 DIAGNOSIS — O24419 Gestational diabetes mellitus in pregnancy, unspecified control: Secondary | ICD-10-CM

## 2023-08-11 DIAGNOSIS — Z3A31 31 weeks gestation of pregnancy: Secondary | ICD-10-CM | POA: Diagnosis not present

## 2023-08-11 DIAGNOSIS — Z3490 Encounter for supervision of normal pregnancy, unspecified, unspecified trimester: Secondary | ICD-10-CM

## 2023-08-11 DIAGNOSIS — Z62810 Personal history of physical and sexual abuse in childhood: Secondary | ICD-10-CM

## 2023-08-11 DIAGNOSIS — R102 Pelvic and perineal pain: Secondary | ICD-10-CM

## 2023-08-11 DIAGNOSIS — O26893 Other specified pregnancy related conditions, third trimester: Secondary | ICD-10-CM
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
## 2023-08-24 ENCOUNTER — Encounter: Payer: Self-pay | Admitting: Family Medicine

## 2023-08-25 ENCOUNTER — Encounter: Payer: Self-pay | Admitting: Family Medicine

## 2023-08-25 ENCOUNTER — Encounter: Payer: Self-pay | Admitting: *Deleted

## 2023-08-27 ENCOUNTER — Ambulatory Visit: Admitting: *Deleted

## 2023-08-27 ENCOUNTER — Ambulatory Visit: Attending: Maternal & Fetal Medicine

## 2023-08-27 ENCOUNTER — Other Ambulatory Visit: Payer: Self-pay | Admitting: *Deleted

## 2023-08-27 VITALS — BP 106/53 | HR 72

## 2023-08-27 DIAGNOSIS — O24410 Gestational diabetes mellitus in pregnancy, diet controlled: Secondary | ICD-10-CM

## 2023-08-27 DIAGNOSIS — O24419 Gestational diabetes mellitus in pregnancy, unspecified control: Secondary | ICD-10-CM | POA: Diagnosis present

## 2023-08-27 DIAGNOSIS — O43193 Other malformation of placenta, third trimester: Secondary | ICD-10-CM

## 2023-08-27 DIAGNOSIS — O0933 Supervision of pregnancy with insufficient antenatal care, third trimester: Secondary | ICD-10-CM

## 2023-08-27 DIAGNOSIS — Z3A33 33 weeks gestation of pregnancy: Secondary | ICD-10-CM

## 2023-09-01 ENCOUNTER — Ambulatory Visit: Payer: Self-pay | Admitting: Family Medicine

## 2023-09-01 ENCOUNTER — Other Ambulatory Visit: Payer: Self-pay

## 2023-09-01 VITALS — BP 113/61 | HR 88 | Wt 163.0 lb

## 2023-09-01 DIAGNOSIS — O99810 Abnormal glucose complicating pregnancy: Secondary | ICD-10-CM

## 2023-09-01 DIAGNOSIS — O0990 Supervision of high risk pregnancy, unspecified, unspecified trimester: Secondary | ICD-10-CM

## 2023-09-01 DIAGNOSIS — O43199 Other malformation of placenta, unspecified trimester: Secondary | ICD-10-CM

## 2023-09-01 DIAGNOSIS — D649 Anemia, unspecified: Secondary | ICD-10-CM

## 2023-09-01 DIAGNOSIS — O99013 Anemia complicating pregnancy, third trimester: Secondary | ICD-10-CM

## 2023-09-01 DIAGNOSIS — Z62810 Personal history of physical and sexual abuse in childhood: Secondary | ICD-10-CM

## 2023-09-01 DIAGNOSIS — R7303 Prediabetes: Secondary | ICD-10-CM

## 2023-09-01 DIAGNOSIS — O24419 Gestational diabetes mellitus in pregnancy, unspecified control: Secondary | ICD-10-CM

## 2023-09-01 DIAGNOSIS — Z3A34 34 weeks gestation of pregnancy: Secondary | ICD-10-CM

## 2023-09-01 DIAGNOSIS — O0933 Supervision of pregnancy with insufficient antenatal care, third trimester: Secondary | ICD-10-CM
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
## 2023-09-07 ENCOUNTER — Encounter: Payer: Self-pay | Admitting: *Deleted

## 2023-09-08 ENCOUNTER — Other Ambulatory Visit: Payer: Self-pay

## 2023-09-08 ENCOUNTER — Ambulatory Visit (INDEPENDENT_AMBULATORY_CARE_PROVIDER_SITE_OTHER): Payer: Self-pay | Admitting: Family Medicine

## 2023-09-08 VITALS — BP 112/68 | HR 91 | Wt 169.4 lb

## 2023-09-08 DIAGNOSIS — O0933 Supervision of pregnancy with insufficient antenatal care, third trimester: Secondary | ICD-10-CM

## 2023-09-08 DIAGNOSIS — O0990 Supervision of high risk pregnancy, unspecified, unspecified trimester: Secondary | ICD-10-CM

## 2023-09-08 DIAGNOSIS — O24419 Gestational diabetes mellitus in pregnancy, unspecified control: Secondary | ICD-10-CM

## 2023-09-08 DIAGNOSIS — R7303 Prediabetes: Secondary | ICD-10-CM

## 2023-09-08 DIAGNOSIS — O99013 Anemia complicating pregnancy, third trimester: Secondary | ICD-10-CM

## 2023-09-08 NOTE — Patient Instructions (Signed)
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
## 2023-09-20 ENCOUNTER — Encounter: Payer: Self-pay | Admitting: Advanced Practice Midwife

## 2023-09-27 ENCOUNTER — Ambulatory Visit: Admitting: *Deleted

## 2023-09-27 ENCOUNTER — Ambulatory Visit

## 2023-09-27 ENCOUNTER — Other Ambulatory Visit: Payer: Self-pay

## 2023-09-27 ENCOUNTER — Ambulatory Visit (HOSPITAL_COMMUNITY)
Admission: EM | Admit: 2023-09-27 | Discharge: 2023-09-27 | Disposition: A | Discharge status: Home / Self Care | Attending: Family Medicine | Admitting: Family Medicine

## 2023-09-27 ENCOUNTER — Encounter (HOSPITAL_COMMUNITY): Payer: Self-pay | Admitting: Emergency Medicine

## 2023-09-27 ENCOUNTER — Encounter: Payer: Self-pay | Admitting: Family Medicine

## 2023-09-27 VITALS — BP 104/64 | HR 98

## 2023-09-27 DIAGNOSIS — J069 Acute upper respiratory infection, unspecified: Secondary | ICD-10-CM | POA: Diagnosis not present

## 2023-09-27 DIAGNOSIS — O43193 Other malformation of placenta, third trimester: Secondary | ICD-10-CM

## 2023-09-27 DIAGNOSIS — Z3A37 37 weeks gestation of pregnancy: Secondary | ICD-10-CM | POA: Diagnosis not present

## 2023-09-27 DIAGNOSIS — O0933 Supervision of pregnancy with insufficient antenatal care, third trimester: Secondary | ICD-10-CM

## 2023-09-27 DIAGNOSIS — O24410 Gestational diabetes mellitus in pregnancy, diet controlled: Secondary | ICD-10-CM

## 2023-09-27 DIAGNOSIS — O24419 Gestational diabetes mellitus in pregnancy, unspecified control: Secondary | ICD-10-CM | POA: Insufficient documentation

## 2023-09-27 DIAGNOSIS — Z331 Pregnant state, incidental: Secondary | ICD-10-CM

## 2023-09-27 DIAGNOSIS — O99013 Anemia complicating pregnancy, third trimester: Secondary | ICD-10-CM

## 2023-09-27 DIAGNOSIS — O0990 Supervision of high risk pregnancy, unspecified, unspecified trimester: Secondary | ICD-10-CM

## 2023-09-27 MED ORDER — AMOXICILLIN 500 MG PO TABS: 500.0000 mg | ORAL_TABLET | Freq: Three times a day (TID) | ORAL | 0 refills | Status: AC

## 2023-09-27 NOTE — ED Triage Notes (Addendum)
 Says she spoke to ob/gyn and based on patient's description, office said not pregnancy related and told to come to urgent care per patient Denies any vaginal bleeding .

## 2023-09-27 NOTE — ED Notes (Signed)
 Stressed repeatedly the importance of keeping appt with ob/gyn today.  Patient stated repeatedly that she is going

## 2023-09-27 NOTE — Discharge Instructions (Signed)
 Take amoxicillin  500 mg--1 tab 3 times daily for 7 days  Tylenol  as needed for the pain  Also a heating pad or warm compress could help  Please keep your follow-up appointment with your OB/GYN for this morning.   You have been swabbed for COVID, and the test will result in the next 24 hours. Our staff will call you if positive. If the COVID test is positive, you should quarantine until you are fever free for 24 hours and you are starting to feel better, and then take added precautions for the next 5 days, such as physical distancing/wearing a mask and good hand hygiene/washing.
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
## 2023-09-27 NOTE — ED Triage Notes (Signed)
 Reports baby not moving quite as much today or yesterday (24 hours)

## 2023-09-27 NOTE — ED Triage Notes (Signed)
 Right flank and chest pain started at one am.  Woke patient out of sleep.  Pain is sharp.  Hurts with coughing, lying down breathing deep.    Has an 11:00 appt with ob/gyn

## 2023-09-28 ENCOUNTER — Encounter: Payer: Self-pay | Admitting: *Deleted

## 2023-09-28 LAB — SARS CORONAVIRUS 2 (TAT 6-24 HRS): SARS Coronavirus 2: NEGATIVE

## 2023-09-29 ENCOUNTER — Encounter: Admitting: Family Medicine
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
## 2023-10-01 ENCOUNTER — Other Ambulatory Visit: Payer: Self-pay

## 2023-10-01 ENCOUNTER — Other Ambulatory Visit (HOSPITAL_COMMUNITY)
Admission: RE | Admit: 2023-10-01 | Discharge: 2023-10-01 | Disposition: A | Discharge status: Home / Self Care | Source: Ambulatory Visit | Attending: Obstetrics and Gynecology | Admitting: Obstetrics and Gynecology

## 2023-10-01 ENCOUNTER — Ambulatory Visit (INDEPENDENT_AMBULATORY_CARE_PROVIDER_SITE_OTHER): Admitting: Obstetrics and Gynecology

## 2023-10-01 VITALS — BP 121/73 | HR 86 | Wt 168.6 lb

## 2023-10-01 DIAGNOSIS — Z3A38 38 weeks gestation of pregnancy: Secondary | ICD-10-CM

## 2023-10-01 DIAGNOSIS — O43199 Other malformation of placenta, unspecified trimester: Secondary | ICD-10-CM

## 2023-10-01 DIAGNOSIS — O0990 Supervision of high risk pregnancy, unspecified, unspecified trimester: Secondary | ICD-10-CM

## 2023-10-01 DIAGNOSIS — O43193 Other malformation of placenta, third trimester: Secondary | ICD-10-CM

## 2023-10-01 DIAGNOSIS — O0993 Supervision of high risk pregnancy, unspecified, third trimester: Secondary | ICD-10-CM

## 2023-10-01 DIAGNOSIS — O99013 Anemia complicating pregnancy, third trimester: Secondary | ICD-10-CM

## 2023-10-01 DIAGNOSIS — O24419 Gestational diabetes mellitus in pregnancy, unspecified control: Secondary | ICD-10-CM
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
## 2023-10-02 ENCOUNTER — Encounter: Payer: Self-pay | Admitting: Family Medicine

## 2023-10-04 LAB — CERVICOVAGINAL ANCILLARY ONLY
Chlamydia: NEGATIVE
Comment: NEGATIVE
Comment: NORMAL
Neisseria Gonorrhea: NEGATIVE

## 2023-10-05 LAB — CULTURE, BETA STREP (GROUP B ONLY): Strep Gp B Culture: NEGATIVE

## 2023-10-06 ENCOUNTER — Inpatient Hospital Stay (HOSPITAL_COMMUNITY)

## 2023-10-06 ENCOUNTER — Ambulatory Visit: Admitting: Family Medicine

## 2023-10-06 ENCOUNTER — Inpatient Hospital Stay (HOSPITAL_COMMUNITY)
Admission: AD | Admit: 2023-10-06 | Discharge: 2023-10-06 | Disposition: A | Discharge status: Home / Self Care | Source: Home / Self Care | Attending: Obstetrics & Gynecology | Admitting: Obstetrics & Gynecology

## 2023-10-06 ENCOUNTER — Encounter (HOSPITAL_COMMUNITY): Payer: Self-pay | Admitting: Obstetrics & Gynecology

## 2023-10-06 ENCOUNTER — Other Ambulatory Visit: Payer: Self-pay

## 2023-10-06 VITALS — BP 121/70 | HR 97 | Wt 168.0 lb

## 2023-10-06 DIAGNOSIS — O479 False labor, unspecified: Secondary | ICD-10-CM

## 2023-10-06 DIAGNOSIS — Z3689 Encounter for other specified antenatal screening: Secondary | ICD-10-CM

## 2023-10-06 DIAGNOSIS — O24419 Gestational diabetes mellitus in pregnancy, unspecified control: Secondary | ICD-10-CM | POA: Diagnosis not present

## 2023-10-06 DIAGNOSIS — Z3A39 39 weeks gestation of pregnancy: Secondary | ICD-10-CM

## 2023-10-06 DIAGNOSIS — Z833 Family history of diabetes mellitus: Secondary | ICD-10-CM | POA: Diagnosis not present

## 2023-10-06 DIAGNOSIS — O0993 Supervision of high risk pregnancy, unspecified, third trimester: Secondary | ICD-10-CM | POA: Diagnosis not present

## 2023-10-06 DIAGNOSIS — O368130 Decreased fetal movements, third trimester, not applicable or unspecified: Secondary | ICD-10-CM

## 2023-10-06 DIAGNOSIS — O0933 Supervision of pregnancy with insufficient antenatal care, third trimester: Secondary | ICD-10-CM | POA: Diagnosis not present

## 2023-10-06 DIAGNOSIS — Z1332 Encounter for screening for maternal depression: Secondary | ICD-10-CM

## 2023-10-06 DIAGNOSIS — O0990 Supervision of high risk pregnancy, unspecified, unspecified trimester: Secondary | ICD-10-CM

## 2023-10-06 DIAGNOSIS — O26893 Other specified pregnancy related conditions, third trimester: Secondary | ICD-10-CM | POA: Insufficient documentation

## 2023-10-06 DIAGNOSIS — O43193 Other malformation of placenta, third trimester: Secondary | ICD-10-CM

## 2023-10-06 DIAGNOSIS — O99013 Anemia complicating pregnancy, third trimester: Secondary | ICD-10-CM | POA: Diagnosis not present

## 2023-10-06 DIAGNOSIS — O24410 Gestational diabetes mellitus in pregnancy, diet controlled: Secondary | ICD-10-CM | POA: Diagnosis not present

## 2023-10-06 DIAGNOSIS — O24420 Gestational diabetes mellitus in childbirth, diet controlled: Secondary | ICD-10-CM | POA: Diagnosis not present

## 2023-10-06 DIAGNOSIS — O9902 Anemia complicating childbirth: Secondary | ICD-10-CM | POA: Diagnosis not present

## 2023-10-06 NOTE — Patient Instructions (Signed)
 Deciding about Circumcision in Baby Boys  (The Basics)  What is circumcision?   Circumcision is a surgery that removes the skin that covers the tip of the penis, called the "foreskin" Circumcision is usually done when a boy is between 52 and 31 days old. In the [place], circumcision is common. In some other countries, fewer boys are circumcised. Circumcision is a common tradition in some religions.  Should I have my baby boy circumcised?   There is no easy answer. Circumcision has some benefits. But it also has risks. After talking with your doctor, you will have to decide for yourself what is right for your family.  What are the benefits of circumcision?   Circumcised boys seem to have slightly lower rates of: ?Urinary tract infections ?Swelling of the opening at the tip of the penis Circumcised men seem to have slightly lower rates of: ?Urinary tract infections ?Swelling of the opening at the tip of the penis ?Penis cancer ?HIV and other infections that you catch during sex ?Cervical cancer in the women they have sex with Even so, in the [place], the risks of these problems are small - even in boys and men who have not been circumcised. Plus, boys and men who are not circumcised can reduce these extra risks by: ?Cleaning their penis well ?Using condoms during sex  What are the risks of circumcision?  Risks include: ?Bleeding or infection from the surgery ?Damage to or amputation of the penis ?A chance that the doctor will cut off too much or not enough of the foreskin ?A chance that sex won't feel as good later in life Only about 1 out of every 200 circumcisions leads to problems. There is also a chance that your health insurance won't pay for circumcision.  How is circumcision done in baby boys?  First, the baby gets medicine for pain relief. This might be a cream on the skin or a shot into the base of the penis. Next, the doctor cleans the baby's penis well. Then  he or she uses special tools to cut off the foreskin. Finally, the doctor wraps a bandage (called gauze) around the baby's penis. If you have your baby circumcised, his doctor or nurse will give you instructions on how to care for him after the surgery. It is important that you follow those instructions carefully.

## 2023-10-06 NOTE — MAU Provider Note (Signed)
Chief Complaint:  Back Pain, Contractions, Pelvic Pain, and Decreased Fetal Movement   Event Date/Time   First Provider Initiated Contact with Patient 10/06/23 2113     HPI  HPI: Maria Dudley is a 28 y.o. G3P1011 at 9w2dwho presents to maternity admissions reporting decreased fetal movement all day   Only feels movement about once per hour  Has occasional contractions. She denies LOF, vaginal bleeding, h/a, n/v, or fever/chills.    RN Note Maria Dudley is a 28 y.o. at [redacted]w[redacted]d here in MAU reporting: DFM - less movement since 0400; reports 1 movement every 1.5 hours. And last movement at 1850. Reports 3 total ctx - 30 minutes apart. Having lower back pain and pelvic pressure since 1850. Denies VB or LOF.   Onset of complaint: 0400  Past Medical History: Past Medical History:  Diagnosis Date   Asthma    GDM (gestational diabetes mellitus)     Past obstetric history: OB History  Gravida Para Term Preterm AB Living  3 1 1  0 1 1  SAB IAB Ectopic Multiple Live Births  1 0 0 0 1    # Outcome Date GA Lbr Len/2nd Weight Sex Type Anes PTL Lv  3 Current           2 Term 05/14/17 [redacted]w[redacted]d  3487 g M Vag-Spont Other  LIV  1 SAB 2016 [redacted]w[redacted]d           Past Surgical History: Past Surgical History:  Procedure Laterality Date   NO PAST SURGERIES      Family History: Family History  Problem Relation Age of Onset   Hyperlipidemia Mother    Obesity Mother    Diabetes Mother    Asthma Sister    Cancer Sister    Diabetes Sister    Hyperlipidemia Maternal Grandmother    Hyperlipidemia Maternal Grandfather    Asthma Maternal Grandfather    Cancer Maternal Grandfather     Social History: Social History   Tobacco Use   Smoking status: Never   Smokeless tobacco: Never  Vaping Use   Vaping status: Never Used  Substance Use Topics   Alcohol use: Not Currently    Comment: socially   Drug use: No    Allergies:  Allergies  Allergen Reactions   Bee Venom Anaphylaxis,  Shortness Of Breath and Swelling    Meds:  Medications Prior to Admission  Medication Sig Dispense Refill Last Dose/Taking   albuterol (VENTOLIN HFA) 108 (90 Base) MCG/ACT inhaler Inhale 1-2 puffs into the lungs every 6 (six) hours as needed for wheezing or shortness of breath. 1 g 6    Alum & Mag Hydroxide-Simeth (ANTACID/ANTIGAS PO) Take 1 tablet by mouth as needed. (Patient not taking: Reported on 10/06/2023)      diphenhydrAMINE (BENADRYL) 25 mg capsule Take 25 mg by mouth every 6 (six) hours as needed.      ferrous gluconate (FERGON) 324 MG tablet Take 1 tablet (324 mg total) by mouth daily with breakfast. (Patient not taking: Reported on 10/06/2023) 30 tablet 3    Prenatal Vit-Fe Fumarate-FA (PREPLUS) 27-1 MG TABS Take 1 tablet by mouth daily. 30 tablet 13     I have reviewed patient's Past Medical Hx, Surgical Hx, Family Hx, Social Hx, medications and allergies.   ROS:  Review of Systems  Constitutional:  Negative for chills and fever.  Gastrointestinal:  Negative for abdominal pain.  Genitourinary:  Negative for vaginal bleeding.   Other systems negative  Physical Exam  Patient Vitals  for the past 24 hrs:  BP Temp Temp src Pulse Resp SpO2 Height Weight  10/06/23 2044 110/65 97.7 F (36.5 C) Oral 79 16 97 % 5\' 8"  (1.727 m) 77.6 kg   Constitutional: Well-developed, well-nourished female in no acute distress.  Cardiovascular: normal rate Respiratory: normal effort GI: Abd soft, non-tender, gravid appropriate for gestational age.   No rebound or guarding. MS: Extremities nontender, no edema, normal ROM Neurologic: Alert and oriented x 4.  GU: Neg CVAT.  PELVIC EXAM: deferred    FHT:  Baseline 130 , moderate variability, accelerations present, no decelerations Contractions: Irregular     Labs: No results found for this or any previous visit (from the past 24 hours). A/Positive/-- (10/07 0950)  Imaging:  BPP 8/8 (10/10) AFI 10.4cm (30%ile)  MAU Course/MDM: I have  reviewed the triage vital signs and the nursing notes.   Pertinent labs & imaging results that were available during my care of the patient were reviewed by me and considered in my medical decision making (see chart for details).      I have reviewed her medical records including past results, notes and treatments.   I have ordered labs and reviewed results. US showed BPP 10/10 NST reviewed Consult Dr Despina Hidden with presentation, exam findings and test results.  Treatments in MAU included EFM, BPP.    Assessment: SIngle IUP at [redacted]w[redacted]d Decreased fetal movement Reassuring NST and BPP  Plan: Discharge home Labor precautions and fetal kick counts Follow up in Office for prenatal visits and recheck Encouraged to return if she develops worsening of symptoms, increase in pain, fever, or other concerning symptoms.   Pt stable at time of discharge.  Wynelle Bourgeois CNM, MSN Certified Nurse-Midwife 10/06/2023 9:13 PM
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
## 2023-10-06 NOTE — MAU Note (Signed)
.  Maria Dudley is a 28 y.o. at [redacted]w[redacted]d here in MAU reporting: DFM - less movement since 0400; reports 1 movement every 1.5 hours. And last movement at 1850. Reports 3 total ctx - 30 minutes apart. Having lower back pain and pelvic pressure since 1850. Denies VB or LOF.   Onset of complaint: 0400 Pain score: 5 - back; 10 - ctx; 10 - pelvic  Vitals:   10/06/23 2044  BP: 110/65  Pulse: 79  Resp: 16  Temp: 97.7 F (36.5 C)  SpO2: 97%     FHT:145 Lab orders placed from triage: none

## 2023-10-09 ENCOUNTER — Encounter (HOSPITAL_COMMUNITY)
Admission: AD | Disposition: A | Discharge status: Home / Self Care | Payer: Self-pay | Source: Home / Self Care | Attending: Obstetrics & Gynecology

## 2023-10-09 ENCOUNTER — Encounter (HOSPITAL_COMMUNITY): Payer: Self-pay | Admitting: Obstetrics & Gynecology

## 2023-10-09 ENCOUNTER — Inpatient Hospital Stay (HOSPITAL_COMMUNITY)
Admission: AD | Admit: 2023-10-09 | Discharge: 2023-10-11 | DRG: 788 | Disposition: A | Discharge status: Home / Self Care | Attending: Obstetrics & Gynecology | Admitting: Obstetrics & Gynecology

## 2023-10-09 ENCOUNTER — Inpatient Hospital Stay (HOSPITAL_COMMUNITY): Admitting: Anesthesiology

## 2023-10-09 DIAGNOSIS — O43193 Other malformation of placenta, third trimester: Secondary | ICD-10-CM | POA: Diagnosis present

## 2023-10-09 DIAGNOSIS — Z833 Family history of diabetes mellitus: Secondary | ICD-10-CM

## 2023-10-09 DIAGNOSIS — Z3A39 39 weeks gestation of pregnancy: Secondary | ICD-10-CM | POA: Diagnosis not present

## 2023-10-09 DIAGNOSIS — O0990 Supervision of high risk pregnancy, unspecified, unspecified trimester: Principal | ICD-10-CM

## 2023-10-09 DIAGNOSIS — Z98891 History of uterine scar from previous surgery: Principal | ICD-10-CM

## 2023-10-09 DIAGNOSIS — O99013 Anemia complicating pregnancy, third trimester: Secondary | ICD-10-CM | POA: Diagnosis present

## 2023-10-09 DIAGNOSIS — O368390 Maternal care for abnormalities of the fetal heart rate or rhythm, unspecified trimester, not applicable or unspecified: Secondary | ICD-10-CM | POA: Diagnosis present

## 2023-10-09 DIAGNOSIS — R7303 Prediabetes: Secondary | ICD-10-CM | POA: Diagnosis present

## 2023-10-09 DIAGNOSIS — O24419 Gestational diabetes mellitus in pregnancy, unspecified control: Secondary | ICD-10-CM | POA: Diagnosis present

## 2023-10-09 DIAGNOSIS — O4423 Partial placenta previa NOS or without hemorrhage, third trimester: Secondary | ICD-10-CM | POA: Diagnosis not present

## 2023-10-09 DIAGNOSIS — O0933 Supervision of pregnancy with insufficient antenatal care, third trimester: Secondary | ICD-10-CM

## 2023-10-09 DIAGNOSIS — O9902 Anemia complicating childbirth: Secondary | ICD-10-CM | POA: Diagnosis present

## 2023-10-09 DIAGNOSIS — O43199 Other malformation of placenta, unspecified trimester: Secondary | ICD-10-CM | POA: Diagnosis present

## 2023-10-09 DIAGNOSIS — O24420 Gestational diabetes mellitus in childbirth, diet controlled: Secondary | ICD-10-CM | POA: Diagnosis present

## 2023-10-09 LAB — GLUCOSE, CAPILLARY
Glucose-Capillary: 96 mg/dL (ref 70–99)
Glucose-Capillary: 110 mg/dL — ABNORMAL HIGH (ref 70–99)
Glucose-Capillary: 87 mg/dL (ref 70–99)

## 2023-10-09 LAB — CBC
HCT: 35.5 % — ABNORMAL LOW (ref 36.0–46.0)
Hemoglobin: 11.6 g/dL — ABNORMAL LOW (ref 12.0–15.0)
MCH: 27.1 pg (ref 26.0–34.0)
MCHC: 32.7 g/dL (ref 30.0–36.0)
MCV: 82.9 fL (ref 80.0–100.0)
Platelets: 229 10*3/uL (ref 150–400)
RBC: 4.28 MIL/uL (ref 3.87–5.11)
RDW: 17.1 % — ABNORMAL HIGH (ref 11.5–15.5)
WBC: 9 10*3/uL (ref 4.0–10.5)
nRBC: 0 % (ref 0.0–0.2)

## 2023-10-09 LAB — TYPE AND SCREEN
ABO/RH(D): A POS
Antibody Screen: NEGATIVE

## 2023-10-09 LAB — RPR: RPR Ser Ql: NONREACTIVE

## 2023-10-09 SURGERY — Surgical Case
Anesthesia: Spinal | Site: Abdomen

## 2023-10-09 MED ORDER — OXYTOCIN-SODIUM CHLORIDE 30-0.9 UT/500ML-% IV SOLN
INTRAVENOUS | Status: AC
  Filled 2023-10-09: qty 500

## 2023-10-09 MED ORDER — BUPIVACAINE IN DEXTROSE 0.75-8.25 % IT SOLN
INTRATHECAL | Status: DC | PRN
  Administered 2023-10-09: 12 mg via INTRATHECAL

## 2023-10-09 MED ORDER — ONDANSETRON HCL 4 MG/2ML IJ SOLN
4.0000 mg | Freq: Four times a day (QID) | INTRAMUSCULAR | Status: DC | PRN
  Administered 2023-10-09: 4 mg via INTRAVENOUS
  Filled 2023-10-09: qty 2

## 2023-10-09 MED ORDER — OXYCODONE HCL 5 MG PO TABS: 5.0000 mg | ORAL_TABLET | Freq: Once | ORAL | Status: DC | PRN

## 2023-10-09 MED ORDER — MORPHINE SULFATE (PF) 0.5 MG/ML IJ SOLN
INTRAMUSCULAR | Status: DC | PRN
  Administered 2023-10-09: 150 ug via INTRATHECAL

## 2023-10-09 MED ORDER — ACETAMINOPHEN 10 MG/ML IV SOLN
INTRAVENOUS | Status: AC
  Filled 2023-10-09: qty 100

## 2023-10-09 MED ORDER — LACTATED RINGERS IV SOLN: 500.0000 mL | INTRAVENOUS | Status: DC | PRN

## 2023-10-09 MED ORDER — ONDANSETRON HCL 4 MG/2ML IJ SOLN
INTRAMUSCULAR | Status: AC
Start: 2023-10-09 — End: ?
  Filled 2023-10-09: qty 2

## 2023-10-09 MED ORDER — ONDANSETRON HCL 4 MG/2ML IJ SOLN
INTRAMUSCULAR | Status: DC | PRN
  Administered 2023-10-09: 4 mg via INTRAVENOUS

## 2023-10-09 MED ORDER — SCOPOLAMINE 1 MG/3DAYS TD PT72: 1.0000 | MEDICATED_PATCH | Freq: Once | TRANSDERMAL | Status: DC

## 2023-10-09 MED ORDER — ACETAMINOPHEN 10 MG/ML IV SOLN
INTRAVENOUS | Status: DC | PRN
  Administered 2023-10-09: 1000 mg via INTRAVENOUS

## 2023-10-09 MED ORDER — EPHEDRINE 5 MG/ML INJ: 10.0000 mg | INTRAVENOUS | Status: DC | PRN

## 2023-10-09 MED ORDER — FENTANYL CITRATE (PF) 100 MCG/2ML IJ SOLN
INTRAMUSCULAR | Status: AC
  Filled 2023-10-09: qty 2

## 2023-10-09 MED ORDER — NALOXONE HCL 4 MG/10ML IJ SOLN: 1.0000 ug/kg/h | INTRAVENOUS | Status: DC | PRN

## 2023-10-09 MED ORDER — FENTANYL CITRATE (PF) 100 MCG/2ML IJ SOLN
INTRAMUSCULAR | Status: DC | PRN
  Administered 2023-10-09: 15 ug via INTRATHECAL

## 2023-10-09 MED ORDER — PHENYLEPHRINE 80 MCG/ML (10ML) SYRINGE FOR IV PUSH (FOR BLOOD PRESSURE SUPPORT)
80.0000 ug | PREFILLED_SYRINGE | INTRAVENOUS | Status: DC | PRN
Start: 2023-10-09 — End: 2023-10-10

## 2023-10-09 MED ORDER — SODIUM CHLORIDE 0.9% FLUSH: 3.0000 mL | INTRAVENOUS | Status: DC | PRN

## 2023-10-09 MED ORDER — LIDOCAINE HCL (PF) 1 % IJ SOLN: 30.0000 mL | INTRAMUSCULAR | Status: DC | PRN

## 2023-10-09 MED ORDER — OXYTOCIN-SODIUM CHLORIDE 30-0.9 UT/500ML-% IV SOLN: 2.5000 [IU]/h | INTRAVENOUS | Status: DC

## 2023-10-09 MED ORDER — LACTATED RINGERS IV SOLN: INTRAVENOUS | Status: DC

## 2023-10-09 MED ORDER — DIPHENHYDRAMINE HCL 50 MG/ML IJ SOLN: 12.5000 mg | INTRAMUSCULAR | Status: DC | PRN

## 2023-10-09 MED ORDER — PHENYLEPHRINE 80 MCG/ML (10ML) SYRINGE FOR IV PUSH (FOR BLOOD PRESSURE SUPPORT): 80.0000 ug | PREFILLED_SYRINGE | INTRAVENOUS | Status: DC | PRN

## 2023-10-09 MED ORDER — DEXAMETHASONE SODIUM PHOSPHATE 10 MG/ML IJ SOLN
INTRAMUSCULAR | Status: AC
  Filled 2023-10-09: qty 1

## 2023-10-09 MED ORDER — ONDANSETRON HCL 4 MG/2ML IJ SOLN: 4.0000 mg | Freq: Three times a day (TID) | INTRAMUSCULAR | Status: DC | PRN

## 2023-10-09 MED ORDER — SOD CITRATE-CITRIC ACID 500-334 MG/5ML PO SOLN
30.0000 mL | ORAL | Status: DC | PRN
  Administered 2023-10-09: 30 mL via ORAL
  Filled 2023-10-09 (×2): qty 30

## 2023-10-09 MED ORDER — OXYTOCIN-SODIUM CHLORIDE 30-0.9 UT/500ML-% IV SOLN
INTRAVENOUS | Status: DC | PRN
  Administered 2023-10-09: 30 [IU] via INTRAVENOUS

## 2023-10-09 MED ORDER — KETOROLAC TROMETHAMINE 30 MG/ML IJ SOLN: 30.0000 mg | Freq: Once | INTRAMUSCULAR | Status: AC | PRN

## 2023-10-09 MED ORDER — LACTATED RINGERS IV SOLN: 500.0000 mL | Freq: Once | INTRAVENOUS | Status: DC

## 2023-10-09 MED ORDER — KETOROLAC TROMETHAMINE 30 MG/ML IJ SOLN
INTRAMUSCULAR | Status: AC
  Filled 2023-10-09: qty 1

## 2023-10-09 MED ORDER — PHENYLEPHRINE HCL-NACL 20-0.9 MG/250ML-% IV SOLN
INTRAVENOUS | Status: DC | PRN
  Administered 2023-10-09: 60 ug/min via INTRAVENOUS

## 2023-10-09 MED ORDER — CEFAZOLIN SODIUM-DEXTROSE 2-3 GM-%(50ML) IV SOLR
INTRAVENOUS | Status: DC | PRN
  Administered 2023-10-09: 2 g via INTRAVENOUS

## 2023-10-09 MED ORDER — OXYCODONE-ACETAMINOPHEN 5-325 MG PO TABS: 1.0000 | ORAL_TABLET | ORAL | Status: DC | PRN

## 2023-10-09 MED ORDER — MEPERIDINE HCL 25 MG/ML IJ SOLN
6.2500 mg | INTRAMUSCULAR | Status: DC | PRN
Start: 2023-10-09 — End: 2023-10-10

## 2023-10-09 MED ORDER — CEFAZOLIN SODIUM-DEXTROSE 2-4 GM/100ML-% IV SOLN
INTRAVENOUS | Status: AC
  Filled 2023-10-09: qty 100

## 2023-10-09 MED ORDER — ACETAMINOPHEN 325 MG PO TABS: 650.0000 mg | ORAL_TABLET | ORAL | Status: DC | PRN

## 2023-10-09 MED ORDER — DEXMEDETOMIDINE HCL IN NACL 80 MCG/20ML IV SOLN
INTRAVENOUS | Status: DC | PRN
  Administered 2023-10-09: 8 ug via INTRAVENOUS
  Administered 2023-10-09: 12 ug via INTRAVENOUS

## 2023-10-09 MED ORDER — STERILE WATER FOR IRRIGATION IR SOLN
Status: DC | PRN
  Administered 2023-10-09: 1000 mL

## 2023-10-09 MED ORDER — OXYCODONE HCL 5 MG/5ML PO SOLN: 5.0000 mg | Freq: Once | ORAL | Status: DC | PRN

## 2023-10-09 MED ORDER — TERBUTALINE SULFATE 1 MG/ML IJ SOLN
0.2500 mg | Freq: Once | INTRAMUSCULAR | Status: AC
  Administered 2023-10-09: 0.25 mg via SUBCUTANEOUS

## 2023-10-09 MED ORDER — FENTANYL-BUPIVACAINE-NACL 0.5-0.125-0.9 MG/250ML-% EP SOLN
12.0000 mL/h | EPIDURAL | Status: DC | PRN
Start: 2023-10-09 — End: 2023-10-10

## 2023-10-09 MED ORDER — OXYTOCIN BOLUS FROM INFUSION: 333.0000 mL | Freq: Once | INTRAVENOUS | Status: DC

## 2023-10-09 MED ORDER — MORPHINE SULFATE (PF) 0.5 MG/ML IJ SOLN
INTRAMUSCULAR | Status: AC
  Filled 2023-10-09: qty 10

## 2023-10-09 MED ORDER — PHENYLEPHRINE HCL-NACL 20-0.9 MG/250ML-% IV SOLN
INTRAVENOUS | Status: AC
  Filled 2023-10-09: qty 250

## 2023-10-09 MED ORDER — TERBUTALINE SULFATE 1 MG/ML IJ SOLN
INTRAMUSCULAR | Status: AC
  Filled 2023-10-09: qty 1

## 2023-10-09 MED ORDER — HYDROMORPHONE HCL 1 MG/ML IJ SOLN: 0.2500 mg | INTRAMUSCULAR | Status: DC | PRN

## 2023-10-09 MED ORDER — OXYCODONE-ACETAMINOPHEN 5-325 MG PO TABS: 2.0000 | ORAL_TABLET | ORAL | Status: DC | PRN

## 2023-10-09 MED ORDER — ONDANSETRON HCL 4 MG/2ML IJ SOLN: 4.0000 mg | Freq: Once | INTRAMUSCULAR | Status: DC | PRN

## 2023-10-09 MED ORDER — DIPHENHYDRAMINE HCL 25 MG PO CAPS
25.0000 mg | ORAL_CAPSULE | ORAL | Status: DC | PRN
  Filled 2023-10-09 (×2): qty 1

## 2023-10-09 MED ORDER — SODIUM CHLORIDE 0.9 % IR SOLN
Status: DC | PRN
  Administered 2023-10-09: 1

## 2023-10-09 MED ORDER — NALOXONE HCL 0.4 MG/ML IJ SOLN: 0.4000 mg | INTRAMUSCULAR | Status: DC | PRN

## 2023-10-09 MED ORDER — KETOROLAC TROMETHAMINE 30 MG/ML IJ SOLN
INTRAMUSCULAR | Status: DC | PRN
  Administered 2023-10-09: 30 mg via INTRAVENOUS

## 2023-10-09 MED ORDER — DEXAMETHASONE SODIUM PHOSPHATE 10 MG/ML IJ SOLN
INTRAMUSCULAR | Status: DC | PRN
  Administered 2023-10-09: 10 mg via INTRAVENOUS

## 2023-10-09 SURGICAL SUPPLY — 31 items
BENZOIN TINCTURE PRP APPL 2/3 (GAUZE/BANDAGES/DRESSINGS) ×1 IMPLANT
CHLORAPREP W/TINT 26 (MISCELLANEOUS) ×2 IMPLANT
CLAMP UMBILICAL CORD (MISCELLANEOUS) ×1 IMPLANT
CLOTH BEACON ORANGE TIMEOUT ST (SAFETY) ×1 IMPLANT
DERMABOND ADVANCED .7 DNX12 (GAUZE/BANDAGES/DRESSINGS) ×1 IMPLANT
DRSG OPSITE POSTOP 4X10 (GAUZE/BANDAGES/DRESSINGS) ×1 IMPLANT
ELECT REM PT RETURN 9FT ADLT (ELECTROSURGICAL) ×1
ELECTRODE REM PT RTRN 9FT ADLT (ELECTROSURGICAL) ×1 IMPLANT
EXTRACTOR VACUUM KIWI (MISCELLANEOUS) IMPLANT
GAUZE SPONGE 4X4 12PLY STRL LF (GAUZE/BANDAGES/DRESSINGS) IMPLANT
GLOVE BIOGEL PI IND STRL 7.0 (GLOVE) ×3 IMPLANT
GLOVE ECLIPSE 6.5 STRL STRAW (GLOVE) ×1 IMPLANT
GOWN STRL REUS W/TWL LRG LVL3 (GOWN DISPOSABLE) ×3 IMPLANT
KIT ABG SYR 3ML LUER SLIP (SYRINGE) IMPLANT
NDL HYPO 25X5/8 SAFETYGLIDE (NEEDLE) IMPLANT
NEEDLE HYPO 25X5/8 SAFETYGLIDE (NEEDLE) IMPLANT
NS IRRIG 1000ML POUR BTL (IV SOLUTION) ×1 IMPLANT
PACK C SECTION WH (CUSTOM PROCEDURE TRAY) ×1 IMPLANT
PAD ABD 7.5X8 STRL (GAUZE/BANDAGES/DRESSINGS) ×1 IMPLANT
PAD OB MATERNITY 4.3X12.25 (PERSONAL CARE ITEMS) ×1 IMPLANT
RTRCTR C-SECT PINK 25CM LRG (MISCELLANEOUS) ×1 IMPLANT
SUT PLAIN 0 NONE (SUTURE) IMPLANT
SUT PLAIN 2 0 XLH (SUTURE) IMPLANT
SUT VIC AB 0 CT1 27XBRD ANBCTR (SUTURE) ×2 IMPLANT
SUT VIC AB 0 CTX36XBRD ANBCTRL (SUTURE) ×3 IMPLANT
SUT VIC AB 2-0 CT1 TAPERPNT 27 (SUTURE) ×1 IMPLANT
SUT VIC AB 3-0 SH 27XBRD (SUTURE) ×1 IMPLANT
SUT VIC AB 4-0 KS 27 (SUTURE) ×1 IMPLANT
TOWEL OR 17X24 6PK STRL BLUE (TOWEL DISPOSABLE) ×1 IMPLANT
TRAY FOLEY W/BAG SLVR 14FR LF (SET/KITS/TRAYS/PACK) IMPLANT
WATER STERILE IRR 1000ML POUR (IV SOLUTION) ×1 IMPLANT

## 2023-10-09 NOTE — Progress Notes (Signed)
Labor Progress Note Maria Dudley is a 28 y.o. G3P1011 at [redacted]w[redacted]d presented for admitted for Cat II fetal heart tracing at term with favorable cervix. S: Coping well. Requesting Nitrous for pain.   O:  BP (!) 94/50 (BP Location: Left Arm)   Pulse 68   Temp 97.9 F (36.6 C) (Oral)   Resp 18   LMP 01/06/2023 (Approximate)   SpO2 99%   EFM: baseline 135, accels, intermittent variable decels, moderate variability TOCO: q4-13min contractions   CVE: Dilation: 5 Effacement (%): 70 Cervical Position: Posterior Station: -2 Presentation: Vertex Exam by:: Dr Leanora Cover   A&P: 28 y.o. [id number] [redacted]w[redacted]d admitted for Cat II EFM #Labor: No cervical change since last exam. Very posterior. Babe asynclitic, -3. Will complete Lavonne series prior AROM to improve fetal station and presentation. Marginal cord insertion.  #Pain: Nitrous #FWB: Cat II; overall reassuring #GBS negative  #GDM diet controlled: 37 weeks: EFW 81% - CBG q4hr, last 87  Wyn Forster, MD 9:30 PM

## 2023-10-09 NOTE — Anesthesia Preprocedure Evaluation (Signed)
Anesthesia Evaluation  Patient identified by MRN, date of birth, ID band Patient awake    Reviewed: Allergy & Precautions, NPO status , Patient's Chart, lab work & pertinent test results  Airway Mallampati: II  TM Distance: >3 FB Neck ROM: Full    Dental no notable dental hx. (+) Teeth Intact, Dental Advisory Given   Pulmonary asthma    Pulmonary exam normal breath sounds clear to auscultation       Cardiovascular negative cardio ROS Normal cardiovascular exam Rhythm:Regular Rate:Normal     Neuro/Psych negative neurological ROS     GI/Hepatic negative GI ROS, Neg liver ROS,,,  Endo/Other  diabetes    Renal/GU      Musculoskeletal   Abdominal   Peds  Hematology Lab Results      Component                Value               Date                      WBC                      9.0                 10/09/2023                HGB                      11.6 (L)            10/09/2023                HCT                      35.5 (L)            10/09/2023                MCV                      82.9                10/09/2023                PLT                      229                 10/09/2023              Anesthesia Other Findings   Reproductive/Obstetrics                             Anesthesia Physical Anesthesia Plan  ASA: 2  Anesthesia Plan: Spinal   Post-op Pain Management: Minimal or no pain anticipated and Regional block*   Induction:   PONV Risk Score and Plan: Treatment may vary due to age or medical condition  Airway Management Planned: Natural Airway  Additional Equipment: None  Intra-op Plan:   Post-operative Plan:   Informed Consent: I have reviewed the patients History and Physical, chart, labs and discussed the procedure including the risks, benefits and alternatives for the proposed anesthesia with the patient or authorized representative who has indicated his/her  understanding and acceptance.     Dental advisory given  Plan Discussed with:  CRNA and Anesthesiologist  Anesthesia Plan Comments: (39.5 wk G3P1 for Spinal for C/S for non reassuring FHT)       Anesthesia Quick Evaluation

## 2023-10-09 NOTE — H&P (Signed)
Maria Dudley is a 28 y.o. female G3P1011  at [redacted]w[redacted]d presenting for labor evaluation. FHR tracing with moderate variability, but variable deceleration with contraction, lasting 1.5-2 minutes.  Admit for variable, monitor for labor progress.    Desires waterbirth for labor management, on hold at time of admission due to variables.   [place] MFM OB F/U on 09/27/23 at 37 weeks:  EFW 81%, cephalic, anterior placenta, FHR 131, AFI wnl  NURSING  PROVIDER  [employer] Dating by   [facility] Model Centering Anatomy U/S Normal, marginal cord insertion f/u to complete anatomy  Initiated care at  24wks                Language  English              LAB RESULTS   Support Person FOB Genetics NIPS: LR AFP:     NT/IT (FT only)     Carrier Screen Horizon:   Rhogam  A/Positive/-- (10/07 0950) A1C/GTT Early: 5.5 Third trimester:   Flu Vaccine Declined    TDaP Vaccine  08/11/23 Blood Type A/Positive/-- (10/07 0950)  Covid Vaccine No Antibody Negative (10/07 0950)  RSV Vaccine  Rubella 2.41 (10/07 0950)  Feeding Plan breast RPR Non Reactive (10/15 0834)  Contraception None HBsAg Negative (10/07 0950)  Circumcision Yes HIV Non Reactive (10/15 0834)  Pediatrician  [facility] HCVAb Non Reactive (10/07 0950)  Prenatal Classes Information given      Pap No results found for: "DIAGPAP"  BTLConsent na GC/CT Initial:   36wks:    VBAC  Consent na GBS Negative/-- (01/10 1257) For PCN allergy, check sensitivities        DME Rx [ N/a] BP cuff [ N/a] Weight Scale Waterbirth  [ ]  Class [ ]  Consent [ ]  CNM visit  PHQ9 & GAD7 [  X] new OB [ X ] 28 weeks  [  ] 36 weeks Induction  [ ]  Orders Entered [ ] Foley Y/N    OB History     Gravida  3   Para  1   Term  1   Preterm  0   AB  1   Living  1      SAB  1   IAB  0   Ectopic  0   Multiple  0   Live Births  1          Past Medical History:  Diagnosis Date   Asthma    GDM (gestational diabetes mellitus)    Past  Surgical History:  Procedure Laterality Date   NO PAST SURGERIES     Family History: family history includes Asthma in her maternal grandfather and sister; Cancer in her maternal grandfather and sister; Diabetes in her mother and sister; Hyperlipidemia in her maternal grandfather, maternal grandmother, and mother; Obesity in her mother. Social History:  reports that she has never smoked. She has never used smokeless tobacco. She reports that she does not currently use alcohol. She reports that she does not use drugs.     Maternal Diabetes: Yes:  Diabetes Type:  Diet controlled Genetic Screening: Normal Maternal Ultrasounds/Referrals: Normal Fetal Ultrasounds or other Referrals:  None Maternal Substance Abuse:  No Significant Maternal Medications:  None Significant Maternal Lab Results:  Group B Strep negative Number of Prenatal Visits:greater than 3 verified prenatal visits Maternal Vaccinations:TDap Other Comments:  None  Review of Systems  Constitutional:  Negative for chills, fatigue and fever.  Eyes:  Negative for visual  disturbance.  Respiratory:  Negative for shortness of breath.   Cardiovascular:  Negative for chest pain.  Gastrointestinal:  Positive for abdominal pain. Negative for vomiting.  Genitourinary:  Negative for difficulty urinating, dysuria, flank pain, pelvic pain, vaginal bleeding, vaginal discharge and vaginal pain.  Neurological:  Negative for dizziness and headaches.  Psychiatric/Behavioral: Negative.     Maternal Medical History:  Reason for admission: Contractions.  Variable deceleration   Contractions: Frequency: regular.   Perceived severity is moderate.   Fetal activity: Perceived fetal activity is normal.   Last perceived fetal movement was within the past hour.   Prenatal complications: no prenatal complications Prenatal Complications - Diabetes: gestational. Diabetes is managed by diet.     Dilation: 3 Effacement (%): 50 Station: -2 Exam  by:: Zenia Resides, RN [id number] Blood pressure (!) 134/55, pulse 68, temperature 99 F (37.2 C), resp. rate 20, last menstrual period 01/06/2023, SpO2 100%. Maternal Exam:  Uterine Assessment: Contraction strength is mild.  Contraction frequency is irregular.  Abdomen: Fetal presentation: vertex Cervix: Cervix evaluated by digital exam.     Fetal Exam Fetal Monitor Review: Mode: ultrasound.   Baseline rate: 135.  Variability: minimal (<5 bpm).   Pattern: no accelerations and variable decelerations.   Fetal State Assessment: Category II - tracings are indeterminate.   Physical Exam Vitals and nursing note reviewed.  Constitutional:      Appearance: She is well-developed.  Cardiovascular:     Rate and Rhythm: Normal rate.  Pulmonary:     Effort: Pulmonary effort is normal.  Abdominal:     Palpations: Abdomen is soft.  Musculoskeletal:        General: Normal range of motion.     Cervical back: Normal range of motion.  Skin:    General: Skin is warm and dry.  Neurological:     Mental Status: She is alert and oriented to person, place, and time.  Psychiatric:        Behavior: Behavior normal.        Thought Content: Thought content normal.        Judgment: Judgment normal.     Prenatal labs: ABO, Rh: A/Positive/-- (10/07 0950) Antibody: Negative (10/07 0950) Rubella: 2.41 (10/07 0950) RPR: Non Reactive (10/15 0834)  HBsAg: Negative (10/07 0950)  HIV: Non Reactive (10/15 0834)  GBS: Negative/-- (01/10 1257)   Assessment/Plan: [id number] at [redacted]w[redacted]d in early labor Variable deceleration at 39 weeks GBS neg GDM diet  Admit  Expectant management on admission but augmentation if needed for labor progress Continuous fetal monitoring, and if reassuring FHR, pt may use immersion in tub but if variables continue, immersion with intermittent FHR monitoring is contraindicated. CNM discussed this with pt at time of admission and pt states understanding. CBG Q 4 hours Plans to  breastfeed Declines contraception  Sharen Counter 10/09/2023, 10:46 AM

## 2023-10-09 NOTE — Anesthesia Procedure Notes (Addendum)
Spinal  Patient location during procedure: OB Start time: 10/09/2023 10:54 PM End time: 10/09/2023 10:57 PM Reason for block: surgical anesthesia Staffing Performed: anesthesiologist  Anesthesiologist: [name], MD Performed by: [name], MD Authorized by: [name], MD   Preanesthetic Checklist Completed: patient identified, IV checked, risks and benefits discussed, surgical consent, monitors and equipment checked, pre-op evaluation and timeout performed Spinal Block Patient position: sitting Prep: DuraPrep and site prepped and draped Patient monitoring: heart rate, cardiac monitor, continuous pulse ox and blood pressure Approach: midline Location: L3-4 Injection technique: single-shot Needle Needle type: Pencan  Needle gauge: 24 G Needle length: 10 cm Needle insertion depth: 5 cm Assessment Sensory level: T4 Events: CSF return Additional Notes 1 Attempt (s). Pt tolerated procedure well.

## 2023-10-09 NOTE — MAU Note (Signed)
..  Maria Dudley is a 28 y.o. at [redacted]w[redacted]d here in MAU reporting: ctx that started Wednesday night, states they have increased in intensity and consistency. They are now every 5 minutes. Denies VB or LOF. +FM.   Pain score: 10 Vitals:   10/09/23 1015  BP: (!) 134/55  Pulse: 68  Resp: 20  Temp: 99 F (37.2 C)  SpO2: 100%     FHT:138 Lab orders placed from triage:   mau labor

## 2023-10-09 NOTE — MAU Provider Note (Signed)
None     S Ms. Arabela Eiring is a 28 y.o. G11P1011 pregnant/non-pregnant female at [redacted]w[redacted]d who presents to MAU today with complaint of contractions every 5-6 minutes. Dolores Lory care at [facility] model of care). Prenatal records reviewed.  Pertinent items noted in HPI and remainder of comprehensive ROS otherwise negative.   O BP (!) 134/55   Pulse 68   Temp 99 F (37.2 C)   Resp 20   LMP 01/06/2023 (Approximate)   SpO2 100%  Physical Exam Vitals reviewed.  Constitutional:      Appearance: Normal appearance.  HENT:     Head: Normocephalic.  Cardiovascular:     Rate and Rhythm: Normal rate.     Pulses: Normal pulses.  Pulmonary:     Effort: Pulmonary effort is normal.  Skin:    General: Skin is warm and dry.     Capillary Refill: Capillary refill takes less than 2 seconds.  Neurological:     Mental Status: She is alert and oriented to person, place, and time.  Psychiatric:        Mood and Affect: Mood normal.        Behavior: Behavior normal.        Thought Content: Thought content normal.        Judgment: Judgment normal.      MDM: PE, SCE, FHRT  MAU Course:  A Latent labor Cat 2 FHRT  Medical screening exam complete  P - Admit to labor and delivery in latent labor at term due to late deceleration noted on [facility].  - Patient desires waterbirth. Has met requirements. Will observe FHRT and if no further decelerations, may reconsider laboring/birth in tub. Currently, given cat 2 tracing, patient not a candidate for intermittent monitoring.  - Patient coping well in labor. Use movement, position changes and shower for management of pain. May consider nitrous.    Lamont Snowball, MSN, CNM 10/09/2023 10:44 AM  Certified Nurse Midwife, [facility]

## 2023-10-09 NOTE — Progress Notes (Signed)
Labor Progress Note Maria Dudley is a 28 y.o. G3P1011 at [redacted]w[redacted]d presented for variable decel in setting of [redacted]w[redacted]d.  S: Reports feeling more ctx and more pressure w ctx.  O:  BP (!) 94/50 (BP Location: Left Arm)   Pulse 68   Temp 97.9 F (36.6 C) (Oral)   Resp 18   LMP 01/06/2023 (Approximate)   SpO2 99%  EFM: 140/mod/+a/occ variables  CVE: Dilation: 5 Effacement (%): 70 Cervical Position: Posterior Station: -2 Presentation: Vertex Exam by:: Dedrick Heffner MD   A&P: 28 y.o. [id number] [redacted]w[redacted]d here for Cat II tracing of [redacted]w[redacted]d  #Labor: Progressing well. Discussed options for augmentation of labor including AROM, Pitocin, stripping membranes, and nipple stimulation. Pt amenable to membrane stripping, which was performed, pt tolerated well. She plans to use breast pump to help increase frequency of ctx. #Pain: Plans on WB if able #FWB: Cat II, but overall reassuring #GBS negative  #A1GDM: EFW 3532g (81%) w AC 97% at [redacted]w[redacted]d  CBG q4h latent, q2h active   #Anemia: HgB 11.6 on admit  #Marginal cord insertion  [name], MD 9:14 PM

## 2023-10-09 NOTE — Progress Notes (Signed)
At bedside for assessment.  Pt noted to have prolonged deceleration to 90 bpm for over 5 mins.  Pt given Terbutaline with slow recovery to 120bpm with moderate variability.  Pt has not been augmented and has had intermittent variables- Cat. 2 tracing since arrival.  At this time, recommendation made to proceed with Primary C-section due to non-reassuring fetal well being remote from delivery.  The risks of surgery were discussed with the patient including but were not limited to: bleeding which may require transfusion or reoperation; infection which may require antibiotics; injury to bowel, bladder, ureters or other surrounding organs; injury to the fetus; need for additional procedures including hysterectomy in the event of a life-threatening hemorrhage; formation of adhesions; placental abnormalities with subsequent pregnancies; incisional problems; thromboembolic phenomenon and other postoperative/anesthesia complications.  The patient concurred with the proposed plan, giving informed written consent for the procedure.    Anesthesia and OR aware. Preoperative prophylactic antibiotics and SCDs ordered on call to the OR.  To OR when ready.  Myna Hidalgo, DO Attending Obstetrician & Gynecologist, [facility] for [employer], [facility]

## 2023-10-10 ENCOUNTER — Other Ambulatory Visit: Payer: Self-pay

## 2023-10-10 DIAGNOSIS — Z98891 History of uterine scar from previous surgery: Secondary | ICD-10-CM

## 2023-10-10 LAB — CBC
HCT: 31.3 % — ABNORMAL LOW (ref 36.0–46.0)
Hemoglobin: 10.5 g/dL — ABNORMAL LOW (ref 12.0–15.0)
MCH: 27.5 pg (ref 26.0–34.0)
MCHC: 33.5 g/dL (ref 30.0–36.0)
MCV: 81.9 fL (ref 80.0–100.0)
Platelets: 216 10*3/uL (ref 150–400)
RBC: 3.82 MIL/uL — ABNORMAL LOW (ref 3.87–5.11)
RDW: 17 % — ABNORMAL HIGH (ref 11.5–15.5)
WBC: 16.4 10*3/uL — ABNORMAL HIGH (ref 4.0–10.5)
nRBC: 0 % (ref 0.0–0.2)

## 2023-10-10 MED ORDER — SENNOSIDES-DOCUSATE SODIUM 8.6-50 MG PO TABS
2.0000 | ORAL_TABLET | Freq: Every day | ORAL | Status: DC
  Administered 2023-10-10 – 2023-10-11 (×2): 2 via ORAL
  Filled 2023-10-10 (×2): qty 2

## 2023-10-10 MED ORDER — OXYTOCIN-SODIUM CHLORIDE 30-0.9 UT/500ML-% IV SOLN: 2.5000 [IU]/h | INTRAVENOUS | Status: AC

## 2023-10-10 MED ORDER — MEDROXYPROGESTERONE ACETATE 150 MG/ML IM SUSP: 150.0000 mg | INTRAMUSCULAR | Status: DC | PRN

## 2023-10-10 MED ORDER — DIPHENHYDRAMINE HCL 25 MG PO CAPS
25.0000 mg | ORAL_CAPSULE | Freq: Four times a day (QID) | ORAL | Status: DC | PRN
  Administered 2023-10-10 (×2): 25 mg via ORAL
  Filled 2023-10-10: qty 1

## 2023-10-10 MED ORDER — ACETAMINOPHEN 325 MG PO TABS
650.0000 mg | ORAL_TABLET | ORAL | Status: DC | PRN
  Administered 2023-10-10 – 2023-10-11 (×2): 650 mg via ORAL
  Filled 2023-10-10 (×2): qty 2

## 2023-10-10 MED ORDER — SIMETHICONE 80 MG PO CHEW: 80.0000 mg | CHEWABLE_TABLET | ORAL | Status: DC | PRN

## 2023-10-10 MED ORDER — MEASLES, MUMPS & RUBELLA VAC IJ SOLR: 0.5000 mL | Freq: Once | INTRAMUSCULAR | Status: DC

## 2023-10-10 MED ORDER — TETANUS-DIPHTH-ACELL PERTUSSIS 5-2.5-18.5 LF-MCG/0.5 IM SUSY: 0.5000 mL | PREFILLED_SYRINGE | Freq: Once | INTRAMUSCULAR | Status: DC

## 2023-10-10 MED ORDER — WITCH HAZEL-GLYCERIN EX PADS: 1.0000 | MEDICATED_PAD | CUTANEOUS | Status: DC | PRN

## 2023-10-10 MED ORDER — IBUPROFEN 600 MG PO TABS
600.0000 mg | ORAL_TABLET | Freq: Four times a day (QID) | ORAL | Status: DC
  Administered 2023-10-11 (×2): 600 mg via ORAL
  Filled 2023-10-10 (×2): qty 1

## 2023-10-10 MED ORDER — ENOXAPARIN SODIUM 40 MG/0.4ML IJ SOSY
40.0000 mg | PREFILLED_SYRINGE | INTRAMUSCULAR | Status: DC
  Administered 2023-10-10: 40 mg via SUBCUTANEOUS
  Filled 2023-10-10: qty 0.4

## 2023-10-10 MED ORDER — SIMETHICONE 80 MG PO CHEW
80.0000 mg | CHEWABLE_TABLET | Freq: Three times a day (TID) | ORAL | Status: DC
  Administered 2023-10-10 – 2023-10-11 (×5): 80 mg via ORAL
  Filled 2023-10-10 (×5): qty 1

## 2023-10-10 MED ORDER — DIBUCAINE (PERIANAL) 1 % EX OINT: 1.0000 | TOPICAL_OINTMENT | CUTANEOUS | Status: DC | PRN

## 2023-10-10 MED ORDER — MENTHOL 3 MG MT LOZG: 1.0000 | LOZENGE | OROMUCOSAL | Status: DC | PRN

## 2023-10-10 MED ORDER — LACTATED RINGERS IV SOLN: INTRAVENOUS | Status: AC

## 2023-10-10 MED ORDER — OXYCODONE HCL 5 MG PO TABS
5.0000 mg | ORAL_TABLET | ORAL | Status: DC | PRN
  Administered 2023-10-11: 10 mg via ORAL
  Filled 2023-10-10: qty 2

## 2023-10-10 MED ORDER — PRENATAL MULTIVITAMIN CH
1.0000 | ORAL_TABLET | Freq: Every day | ORAL | Status: DC
  Administered 2023-10-10 – 2023-10-11 (×2): 1 via ORAL
  Filled 2023-10-10 (×2): qty 1

## 2023-10-10 MED ORDER — KETOROLAC TROMETHAMINE 30 MG/ML IJ SOLN
30.0000 mg | Freq: Four times a day (QID) | INTRAMUSCULAR | Status: AC
  Administered 2023-10-10 – 2023-10-11 (×4): 30 mg via INTRAVENOUS
  Filled 2023-10-10 (×4): qty 1

## 2023-10-10 MED ORDER — COCONUT OIL OIL: 1.0000 | TOPICAL_OIL | Status: DC | PRN

## 2023-10-10 NOTE — Lactation Note (Signed)
This note was copied from a baby's chart. Lactation Consultation Note  Patient Name: Maria Dudley OZHYQ'M Date: 10/10/2023 Age:29 hours Reason for consult: Initial assessment;Term;Breastfeeding assistance;Maternal endocrine disorder-GDM and C/S delivery.  MOB latched infant on her right breast with pillows support using the football hold, initially infant was on and off the breast but after 5 minutes infant started sustaining his latch and was still BF after 12 minutes when LC left the room. MOB is experienced with BF see maternal data below. MOB will continue to BF infant by cues, on demand, every 2-3 hours, skin to skin. MOB knows to call for further latch assistance if needed. LC discussed the importance of maternal rest, balance meals& snacks , hydration. MOB was  made aware of O/P services, breastfeeding support groups, community resources, and our phone # for post-discharge questions.     Maternal Data Has patient been taught Hand Expression?: Yes Does the patient have breastfeeding experience prior to this delivery?: Yes How long did the patient breastfeed?: Per MOB, she BF her 25 year old son for 5 years.  Feeding Mother's Current Feeding Choice: Breast Milk and Formula  LATCH Score Latch: Repeated attempts needed to sustain latch, nipple held in mouth throughout feeding, stimulation needed to elicit sucking reflex.  Audible Swallowing: A few with stimulation  Type of Nipple: Everted at rest and after stimulation  Comfort (Breast/Nipple): Soft / non-tender  Hold (Positioning): Assistance needed to correctly position infant at breast and maintain latch.  LATCH Score: 7   Lactation Tools Discussed/Used    Interventions Interventions: Breast feeding basics reviewed;Assisted with latch;Skin to skin;Breast compression;Adjust position;Support pillows;Position options;Education;LC Services brochure  Discharge    Consult Status Consult Status: Follow-up Date:  10/11/23 Follow-up type: In-patient    Frederico Hamman 10/10/2023, 3:32 AM

## 2023-10-10 NOTE — Transfer of Care (Signed)
Immediate Anesthesia Transfer of Care Note  Patient: Maria Dudley  Procedure(s) Performed: CESAREAN SECTION (Abdomen)  Patient Location: PACU  Anesthesia Type:Spinal  Level of Consciousness: awake, alert , and oriented  Airway & Oxygen Therapy: Patient Spontanous Breathing  Post-op Assessment: Report given to RN and Post -op Vital signs reviewed and stable  Post vital signs: Reviewed and stable  Last Vitals:  Vitals Value Taken Time  BP 101/49 10/10/23 0018  Temp    Pulse 78 10/10/23 0018  Resp 14 10/10/23 0018  SpO2 96 % 10/10/23 0018  Vitals shown include unfiled device data.  Last Pain:  Vitals:   10/09/23 2100  TempSrc:   PainSc: 10-Worst pain ever         Complications: No notable events documented.

## 2023-10-10 NOTE — Progress Notes (Signed)
Pt tolerated ambulating to bathroom well at 0450. C/o some dizziness with ambulation back to bed but able to make it back to bed with RN assist without complications. Upon being back in bed, pt c/o sharp "prickly" pain midsternum. Denies shortness of breathe. O2 sat 100% on room air, HR 61. Pt states she wonders if it could be gas. Pt reports it is better with deep breathe. Pt reports pain completely gone at 0503. Dr. Leanora Cover made aware while rounding on floor at 0615.

## 2023-10-10 NOTE — Anesthesia Postprocedure Evaluation (Signed)
Anesthesia Post Note  Patient: Maria Dudley  Procedure(s) Performed: CESAREAN SECTION (Abdomen)     Patient location during evaluation: Mother Baby Anesthesia Type: Spinal Level of consciousness: oriented and awake and alert Pain management: pain level controlled Vital Signs Assessment: post-procedure vital signs reviewed and stable Respiratory status: spontaneous breathing and respiratory function stable Cardiovascular status: blood pressure returned to baseline and stable Postop Assessment: no headache, no backache, no apparent nausea or vomiting and able to ambulate Anesthetic complications: no   No notable events documented.  Last Vitals:  Vitals:   10/10/23 0030 10/10/23 0045  BP: (!) 97/48 (!) 84/51  Pulse: 94 75  Resp: 13 14  Temp:    SpO2: 96%     Last Pain:  Vitals:   10/10/23 0030  TempSrc:   PainSc: 0-No pain   Pain Goal:    LLE Motor Response: No movement due to regional block (10/10/23 0030) LLE Sensation: No sensation (absent) (10/10/23 0030) RLE Motor Response: No movement due to regional block (10/10/23 0030) RLE Sensation: No sensation (absent) (10/10/23 0030)     Epidural/Spinal Function Cutaneous sensation: No Sensation (10/10/23 0030), Patient able to flex knees: No (10/10/23 0030), Patient able to lift hips off bed: No (10/10/23 0030), Back pain beyond tenderness at insertion site: No (10/10/23 0030), Progressively worsening motor and/or sensory loss: No (10/10/23 0030), Bowel and/or bladder incontinence post epidural: No (10/10/23 0030)  [name]

## 2023-10-10 NOTE — Progress Notes (Signed)
POSTPARTUM PROGRESS NOTE  POD #1  Subjective:  Maria Dudley is a 28 y.o. [id number] s/p primary LTCS at [redacted]w[redacted]d.  No acute events overnight. She reports she is doing well. She is less than 12 hours post op. Pain is moderately controlled.  Lochia is light.  Objective: BP (!) 102/58   Pulse (!) 54   Temp 97.8 F (36.6 C)   Resp 20   LMP 01/06/2023 (Approximate)   SpO2 99%   Breastfeeding Unknown   Physical Exam:  General: alert, cooperative and no distress Chest: no respiratory distress Heart:regular rate, distal pulses intact Abdomen: soft, nontender,  Uterine Fundus: firm, appropriately tender DVT Evaluation: No calf swelling or tenderness Extremities: no LE edema Skin: warm, dry; incision clean/dry/intact   Recent Labs    10/09/23 1055 10/10/23 0445  HGB 11.6* 10.5*  HCT 35.5* 31.3*    Assessment/Plan: Maria Dudley is a 28 y.o. [id number] s/p primary LTCS at [redacted]w[redacted]d for NRFHT.  POD#1 - Doing welll; pain well controlled. H/H appropriate  Routine postpartum care  OOB, ambulated  Lovenox for VTE prophylaxis Anemia: asymptomatic, minimal Contraception: declines Feeding: breast GDMA1: fasting glucose tomorrow AM  Dispo: Plan for discharge POD#2-3 pending clinical course.   LOS: 1 day   Venora Maples, MD/MPH Attending Family Medicine Physician, [facility] for [facility], [facility]  10/10/2023, 9:08 AM

## 2023-10-10 NOTE — Op Note (Addendum)
Maria Dudley PROCEDURE DATE: 10/10/2023  PREOPERATIVE DIAGNOSES: Intrauterine pregnancy at [redacted]w[redacted]d weeks gestation; non-reassuring fetal status remote from delivery  POSTOPERATIVE DIAGNOSES: The same  PROCEDURE: Primary Low Transverse Cesarean Section  SURGEON:  Dr. Myna Hidalgo  ASSISTANT:  Dr. Wyn Forster An experienced assistant was required given the standard of surgical care given the complexity of the case.  This assistant was needed for exposure, dissection, suctioning, retraction, instrument exchange, and for overall help during the procedure.   ANESTHESIOLOGY TEAM: Anesthesiologist: [name], MD CRNA: Sheppard Evens, CRNA  INDICATIONS: Maria Dudley is a 28 y.o. [phone] at [redacted]w[redacted]d here for cesarean section secondary to the indications listed under preoperative diagnoses; please see preoperative note for further details.  The risks of surgery were discussed with the patient including but were not limited to: bleeding which may require transfusion or reoperation; infection which may require antibiotics; injury to bowel, bladder, ureters or other surrounding organs; injury to the fetus; need for additional procedures including hysterectomy in the event of a life-threatening hemorrhage; formation of adhesions; placental abnormalities wth subsequent pregnancies; incisional problems; thromboembolic phenomenon and other postoperative/anesthesia complications.  The patient concurred with the proposed plan, giving informed written consent for the procedure.    FINDINGS:  Viable female infant in cephalic presentation.  Apgars 8 and 9.  Amniotic fluid: clear.  Intact placenta, three vessel cord.  Normal uterus, fallopian tubes and ovaries bilaterally. OP fetal position. Body cord, arm cords and leg cord.  ANESTHESIA: spinal INTRAVENOUS FLUIDS: 1500 ml   ESTIMATED BLOOD LOSS: 299 ml URINE OUTPUT:  500 ml SPECIMENS: Placenta sent to L&D  COMPLICATIONS: None  immediate  PROCEDURE IN DETAIL:  The patient preoperatively received intravenous antibiotics and had sequential compression devices applied to her lower extremities.  She was then taken to the operating room where spinal anesthesia was found to be adequate. She was then placed in a dorsal supine position with a leftward tilt, and prepped and draped in a sterile manner.  A foley catheter was  placed into her bladder and attached to constant gravity.  After an adequate timeout was performed, a Pfannenstiel skin incision was made with scalpel and carried through to the underlying layer of fascia. The fascia was incised in the midline, and this incision was extended bluntly. The rectus muscles were separated in the midline and the peritoneum was entered bluntly.   The Alexis self-retaining retractor was introduced into the abdominal cavity.  Attention was turned to the lower uterine segment where a low transverse hysterotomy was made with a scalpel and extended bilaterally bluntly.  The infant was successfully delivered, the cord was clamped and cut after one minute, and the infant was handed over to the awaiting neonatology team. Uterine massage was then administered, and the placenta delivered intact with a three-vessel cord. The uterus was then cleared of clots and debris.  The hysterotomy was closed with 0 Vicryl in a running fashion in a single layer.  Interrupted stitch of 0 Vicryl was used to obtain hemostasis.    The pelvis was cleared of all clot and debris. Hemostasis was confirmed on all surfaces.  The retractor was removed.  The peritoneum was closed with a 2-0 Vicryl running stitch. The fascia was then closed using 0 Vicryl in a running fashion.  The subcutaneous layer was irrigated, and was found to be hemostatic and any areas of bleeding were cauterized with the bovie. The skin was closed with a 4-0 monocryl subcuticular stitch. The patient tolerated the  procedure well. Sponge, instrument and  needle counts were correct x 3.  She was taken to the recovery room in stable condition.    Wyn Forster, MD FMOB Fellow, Faculty practice [facility], Center for [facility]

## 2023-10-10 NOTE — Discharge Summary (Signed)
Postpartum Discharge Summary  Date of Service updated***     Patient Name: Maria Dudley DOB: 02/20/1996 MRN: [id number]  Date of admission: 10/09/2023 Delivery date:10/09/2023 Delivering provider: Myna Hidalgo Date of discharge: 10/10/2023  Admitting diagnosis: Variable fetal heart rate decelerations, antepartum [O36.8390] Intrauterine pregnancy: [redacted]w[redacted]d     Secondary diagnosis:  Principal Problem:   Variable fetal heart rate decelerations, antepartum Active Problems:   S/P cesarean section  Additional problems: ***    Discharge diagnosis: Term Pregnancy Delivered and GDM A1                                              Post partum procedures:{Postpartum procedures:23558} Augmentation: AROM Complications: {OB Labor/Delivery Complications:20784}  Hospital course: Onset of Labor With Unplanned C/S   28 y.o. yo [id number] at [redacted]w[redacted]d was admitted in Active Labor on 10/09/2023. Patient had a labor course significant for recurrent variable decels. The patient went for cesarean section due to Non-Reassuring FHR. Delivery details as follows: Membrane Rupture Time/Date:  ,   Delivery Method:C-Section, Low Transverse Operative Delivery:N/A Details of operation can be found in separate operative note. Patient had a postpartum course complicated by***.  She is ambulating,tolerating a regular diet, passing flatus, and urinating well.  Patient is discharged home in stable condition 10/10/23.  Newborn Data: Birth date:10/09/2023 Birth time:11:18 PM Gender:Female Living status:Living Apgars:8 ,9  Weight:3240 g  Magnesium Sulfate received: {Mag received:30440022} BMZ received: No Rhophylac:N/A MMR:Yes T-DaP:Given prenatally Flu: No RSV Vaccine received: No Transfusion:{Transfusion received:30440034}  Immunizations received: Immunization History  Administered Date(s) Administered   Tdap 08/11/2023    Physical exam  Vitals:   10/09/23 1845 10/09/23 1850 10/09/23 1939 10/09/23 2045   BP:   127/61 (!) 94/50  Pulse:   81 68  Resp:   19 18  Temp:   97.9 F (36.6 C)   TempSrc:   Oral   SpO2: 98% 99%     General: {Exam; general:21111117} Lochia: {Desc; appropriate/inappropriate:30686::"appropriate"} Uterine Fundus: {Desc; firm/soft:30687} Incision: {Exam; incision:21111123} DVT Evaluation: {Exam; dvt:2111122} Labs: Lab Results  Component Value Date   WBC 9.0 10/09/2023   HGB 11.6 (L) 10/09/2023   HCT 35.5 (L) 10/09/2023   MCV 82.9 10/09/2023   PLT 229 10/09/2023      Latest Ref Rng & Units 05/31/2023   11:38 AM  CMP  Glucose 70 - 99 mg/dL 75   BUN 6 - 20 mg/dL 6   Creatinine 1.30 - 8.65 mg/dL 7.84   Sodium 696 - 295 mmol/L 135   Potassium 3.5 - 5.2 mmol/L 4.4   Chloride 96 - 106 mmol/L 99   CO2 20 - 29 mmol/L 16   Calcium 8.7 - 10.2 mg/dL 9.8   Total Protein 6.0 - 8.5 g/dL 6.9   Total Bilirubin 0.0 - 1.2 mg/dL 0.2   Alkaline Phos 44 - 121 IU/L 62   AST 0 - 40 IU/L 31   ALT 0 - 32 IU/L 34    Edinburgh Score:    05/16/2017    9:05 AM  Edinburgh Postnatal Depression Scale Screening Tool  I have been able to laugh and see the funny side of things. 0  I have looked forward with enjoyment to things. 0  I have blamed myself unnecessarily when things went wrong. 1  I have been anxious or worried for no good reason. 0  I have felt  scared or panicky for no good reason. 0  Things have been getting on top of me. 0  I have been so unhappy that I have had difficulty sleeping. 0  I have felt sad or miserable. 0  I have been so unhappy that I have been crying. 0  The thought of harming myself has occurred to me. 0  Edinburgh Postnatal Depression Scale Total 1   No data recorded  After visit meds:  Allergies as of 10/10/2023       Reactions   Bee Venom Anaphylaxis, Shortness Of Breath, Swelling     Med Rec must be completed prior to using this [facility]***        Discharge home in stable condition Infant Feeding: Breast Infant Disposition:home  with mother Discharge instruction: per After Visit Summary and Postpartum booklet. Activity: Advance as tolerated. Pelvic rest for 6 weeks.  Diet: routine diet Future Appointments: Future Appointments  Date Time Provider Department Center  10/20/2023  9:00 AM CENTERING PROVIDER [facility]  11/03/2023  9:00 AM CENTERING PROVIDER [facility]  12/13/2023 10:00 AM Arnette Felts, FNP [facility] None   Follow up Visit:  Follow-up Information     [facility] Follow up in 1 week(s).   Specialty: Obstetrics and Gynecology Contact information: [address] [place] [address] [phone]               Message sent to [facility] on 1/19 by Dr. Leanora Cover  Please schedule this patient for a In person postpartum visit in 4 weeks with the following provider: Any provider. Additional Postpartum F/U:Postpartum Depression checkup, 2 hour GTT, and Incision check 1 week  High risk pregnancy complicated by: GDM Delivery mode:  C-Section, Low Transverse Anticipated Birth Control:  Unsure   10/10/2023 Wyn Forster, MD

## 2023-10-11 ENCOUNTER — Other Ambulatory Visit (HOSPITAL_COMMUNITY): Payer: Self-pay

## 2023-10-11 ENCOUNTER — Encounter (HOSPITAL_COMMUNITY): Payer: Self-pay | Admitting: Obstetrics & Gynecology

## 2023-10-11 MED ORDER — IBUPROFEN 600 MG PO TABS
600.0000 mg | ORAL_TABLET | Freq: Four times a day (QID) | ORAL | 1 refills | Status: AC
  Filled 2023-10-11: qty 60, 15d supply, fill #0

## 2023-10-11 MED ORDER — ACETAMINOPHEN 325 MG PO TABS: 650.0000 mg | ORAL_TABLET | Freq: Four times a day (QID) | ORAL | 1 refills | Status: DC | PRN

## 2023-10-11 MED ORDER — SENNOSIDES-DOCUSATE SODIUM 8.6-50 MG PO TABS
2.0000 | ORAL_TABLET | Freq: Every day | ORAL | 1 refills | Status: AC
  Filled 2023-10-11: qty 100, 50d supply, fill #0

## 2023-10-11 MED ORDER — OXYCODONE HCL 5 MG PO TABS: 5.0000 mg | ORAL_TABLET | Freq: Four times a day (QID) | ORAL | 0 refills | Status: DC | PRN

## 2023-10-11 MED ORDER — IBUPROFEN 600 MG PO TABS: 600.0000 mg | ORAL_TABLET | Freq: Four times a day (QID) | ORAL | 1 refills | Status: DC

## 2023-10-11 MED ORDER — OXYCODONE HCL 5 MG PO TABS
5.0000 mg | ORAL_TABLET | Freq: Four times a day (QID) | ORAL | 0 refills | Status: DC | PRN
  Filled 2023-10-11: qty 15, 2d supply, fill #0

## 2023-10-11 MED ORDER — ACETAMINOPHEN 325 MG PO TABS
650.0000 mg | ORAL_TABLET | Freq: Four times a day (QID) | ORAL | 1 refills | Status: AC | PRN
  Filled 2023-10-11: qty 60, 8d supply, fill #0

## 2023-10-11 MED ORDER — SENNOSIDES-DOCUSATE SODIUM 8.6-50 MG PO TABS: 2.0000 | ORAL_TABLET | Freq: Every day | ORAL | 1 refills | Status: DC

## 2023-10-11 NOTE — Progress Notes (Signed)
Post Partum Day 2 Subjective: no complaints, up ad lib, voiding, tolerating PO, + flatus, and +BM . Reporting increased pain this morning but states she took ibuprofen around 0700  Objective: Blood pressure 115/63, pulse 66, temperature 97.9 F (36.6 C), temperature source Oral, resp. rate 20, last menstrual period 01/06/2023, SpO2 99%, unknown if currently breastfeeding.  Physical Exam:  General: alert, cooperative, and appears stated age Lochia: appropriate Uterine Fundus: firm Incision: healing well, no significant drainage, no dehiscence DVT Evaluation: No evidence of DVT seen on physical exam.  Recent Labs    10/09/23 1055 10/10/23 0445  HGB 11.6* 10.5*  HCT 35.5* 31.3*    Assessment/Plan: Discharge home, declines contraception. Discussed oxycodone PRN. Discussed abdominal binder.   LOS: 2 days   Iantha Fallen, Student-MidWife 10/11/2023, 7:55 AM

## 2023-10-11 NOTE — Lactation Note (Signed)
This note was copied from a baby's chart. Lactation Consultation Note  Patient Name: Maria Dudley [id number] Date: 10/11/2023 Age:29 hours Reason for consult: Follow-up assessment  Encouraged mother to offer breast before formula to help establish her milk supply.  Provided education regarding cluster feeding.  Insurance does not qualify for a stork pump.  Provided other phone numbers to call. Reviewed engorgement care and monitoring voids/stools. Suggest calling if family desires latch assistance.    Maternal Data Has patient been taught Hand Expression?: Yes  Feeding Mother's Current Feeding Choice: Breast Milk and Formula Nipple Type: Slow - flow  Interventions Interventions: Education  Discharge Discharge Education: Engorgement and breast care;Warning signs for feeding baby Pump: Advised to call insurance company (Does not qualify for stork pump)  Consult Status Consult Status: Complete Date: 10/11/23    [name] [facility] 10/11/2023, 10:13 AM

## 2023-10-11 NOTE — Lactation Note (Signed)
This note was copied from a baby's chart. Lactation Consultation Note  Patient Name: Maria Dudley [id number] Date: 10/11/2023 Age:29 hours Reason for consult: Follow-up assessment  Returned to room to assist with feeding. Baby has been sleepy and spitty.  Attempted with and without NS and baby briefly latched without nipple shield.  Mother can easily express colostrum drops. Encouraged mother to pump at home until baby is sustaining latch to stimulate her milk supply. Provided volume guidelines.   Maternal Data Has patient been taught Hand Expression?: Yes  Feeding Mother's Current Feeding Choice: Breast Milk and Formula Nipple Type: Slow - flow  LATCH Score Latch: Repeated attempts needed to sustain latch, nipple held in mouth throughout feeding, stimulation needed to elicit sucking reflex.  Audible Swallowing: A few with stimulation  Type of Nipple: Everted at rest and after stimulation  Comfort (Breast/Nipple): Soft / non-tender  Hold (Positioning): Assistance needed to correctly position infant at breast and maintain latch.  LATCH Score: 7   Lactation Tools Discussed/Used    Interventions Interventions: Breast feeding basics reviewed;Assisted with latch;Skin to skin;Hand express;Education;Hand pump  Discharge Discharge Education: Engorgement and breast care;Warning signs for feeding baby Pump: Advised to call insurance company (Does not qualify for stork pump)  Consult Status Consult Status: Complete Date: 10/11/23    [name] [facility] 10/11/2023, 1:29 PM

## 2023-10-15 ENCOUNTER — Other Ambulatory Visit: Payer: Self-pay | Admitting: Obstetrics and Gynecology

## 2023-10-15 ENCOUNTER — Other Ambulatory Visit: Payer: Self-pay

## 2023-10-16 ENCOUNTER — Other Ambulatory Visit: Payer: Self-pay

## 2023-10-16 ENCOUNTER — Inpatient Hospital Stay (HOSPITAL_COMMUNITY)
Admission: AD | Admit: 2023-10-16 | Discharge: 2023-10-16 | Disposition: A | Discharge status: Home / Self Care | Attending: Obstetrics and Gynecology | Admitting: Obstetrics and Gynecology

## 2023-10-16 ENCOUNTER — Encounter (HOSPITAL_COMMUNITY): Payer: Self-pay | Admitting: Obstetrics and Gynecology

## 2023-10-16 DIAGNOSIS — G8918 Other acute postprocedural pain: Secondary | ICD-10-CM | POA: Diagnosis present

## 2023-10-16 DIAGNOSIS — O9089 Other complications of the puerperium, not elsewhere classified: Secondary | ICD-10-CM | POA: Diagnosis present

## 2023-10-16 MED ORDER — GABAPENTIN 300 MG PO CAPS: 300.0000 mg | ORAL_CAPSULE | Freq: Three times a day (TID) | ORAL | 0 refills | Status: AC

## 2023-10-16 MED ORDER — OXYCODONE HCL 5 MG PO TABS: 5.0000 mg | ORAL_TABLET | Freq: Four times a day (QID) | ORAL | 0 refills | Status: AC | PRN

## 2023-10-16 NOTE — MAU Provider Note (Signed)
History     CSN: [id number]  Arrival date and time: 10/16/23 1024   Event Date/Time   First Provider Initiated Contact with Patient 10/16/23 1056      Chief Complaint  Patient presents with   Abdominal Pain   HPI Maria Dudley is a 28 y.o. [id number] female at 6 days post partum. She had a primary c/section on 1/19 for non reassuring fetal status. Reports continued incisional pain that is not well controlled since running out of oxycodone yesterday. Also feels like pain made worse by being too active since going home. Denies increased vaginal bleeding, fever, or body aches.   OB History     Gravida  3   Para  2   Term  2   Preterm  0   AB  1   Living  2      SAB  1   IAB  0   Ectopic  0   Multiple  0   Live Births  2           Past Medical History:  Diagnosis Date   Asthma    GDM (gestational diabetes mellitus)     Past Surgical History:  Procedure Laterality Date   CESAREAN SECTION N/A 10/09/2023   Procedure: CESAREAN SECTION;  Surgeon: Myna Hidalgo, DO;  Location: [facility] LD ORS;  Service: Obstetrics;  Laterality: N/A;    Family History  Problem Relation Age of Onset   Hyperlipidemia Mother    Obesity Mother    Diabetes Mother    Asthma Sister    Cancer Sister    Diabetes Sister    Hyperlipidemia Maternal Grandmother    Hyperlipidemia Maternal Grandfather    Asthma Maternal Grandfather    Cancer Maternal Grandfather     Social History   Tobacco Use   Smoking status: Never   Smokeless tobacco: Never  Vaping Use   Vaping status: Never Used  Substance Use Topics   Alcohol use: Not Currently    Comment: socially   Drug use: No    Allergies:  Allergies  Allergen Reactions   Bee Venom Anaphylaxis, Shortness Of Breath and Swelling    No medications prior to admission.    Review of Systems  All other systems reviewed and are negative.  Physical Exam   Blood pressure (!) 119/58, pulse 65, temperature 98.1 F (36.7 C),  temperature source Oral, height 5\' 8"  (1.727 m), weight 73.7 kg, SpO2 96%, unknown if currently breastfeeding.  Physical Exam Vitals and nursing note reviewed.  Constitutional:      General: She is not in acute distress.    Appearance: She is well-developed. She is not ill-appearing.  HENT:     Head: Normocephalic and atraumatic.  Eyes:     General: No scleral icterus.       Right eye: No discharge.        Left eye: No discharge.     Conjunctiva/sclera: Conjunctivae normal.  Pulmonary:     Effort: Pulmonary effort is normal. No respiratory distress.  Abdominal:     Palpations: Abdomen is soft.     Comments: Honeycomb dressing removed. Incision C/D/I.   Neurological:     General: No focal deficit present.     Mental Status: She is alert.  Psychiatric:        Mood and Affect: Mood normal.        Behavior: Behavior normal.     MAU Course  Procedures  MDM   Assessment and Plan  1. Cesarean delivery delivered   2. Post-op pain    -Incision appears well healing without signs of infection. Discussed pain management with patient. She has been taking tylenol & ibuprofen together. Discussed staggering tylenol & ibuprofen for better coverage. Only being active enough to take care of herself & baby. Will give 3 days more of oxycodone (appropriate after PDMP review). Will also prescribe 2 days of scheduled gabapentin.  -Patient to keep incision check appt on 1/28  Judeth Horn 10/16/2023, 11:31 AM

## 2023-10-16 NOTE — MAU Note (Signed)
Maria Dudley is a 28 y.o. at Unknown here in MAU reporting: she's having abdominal pain in incision and near umbilicus.  States it hurts to walk & sit.  Reports pain has worsened since she no longer has any Oxycodone, last dose taken was yesterday @ noon.   States she's been taken Tylenol & Ibuprofen to manage pain and isn't working.  Reports pain was managed better with Oxycodone S/P Cesarean 10/09/2023 LMP: NA Onset of complaint: yesterday Pain score: 9 Vitals:   10/16/23 1039  BP: (!) 119/58  Pulse: 65  Temp: 98.1 F (36.7 C)  SpO2: 96%     FHT: NA  Lab orders placed from triage: None

## 2023-10-19 ENCOUNTER — Other Ambulatory Visit: Payer: Self-pay

## 2023-10-19 ENCOUNTER — Ambulatory Visit

## 2023-10-19 VITALS — BP 121/68 | HR 60 | Wt 160.0 lb

## 2023-10-19 DIAGNOSIS — Z5189 Encounter for other specified aftercare: Secondary | ICD-10-CM

## 2023-10-19 NOTE — Progress Notes (Signed)
Incision Check Visit  Maria Dudley is here for incision check following primary c-section on 10/09/23. Incision appears clean, dry, and appears fully intact; covered by transparent dermabond. Reviewed good wound care and s/s of infection with patient. Reports pain is well controlled with combination of Tylenol, ibuprofen, gabapentin, and oxycodone. Reports some loose stool, plans to decrease Senokot to 1 tablet a day.   Having some issue with breastfeeding. Baby is able to latch with minimal discomfort but then pops off nipple. Patient desires lactation visit for further assistance. Appt made for tomorrow, 10/20/23 at 1:15 PM.  Marjo Bicker, RN 10/19/2023  2:46 PM

## 2023-10-20 ENCOUNTER — Encounter

## 2023-11-08 ENCOUNTER — Ambulatory Visit: Admitting: Obstetrics and Gynecology

## 2023-11-08 DIAGNOSIS — O24419 Gestational diabetes mellitus in pregnancy, unspecified control: Secondary | ICD-10-CM

## 2023-11-29 ENCOUNTER — Ambulatory Visit: Admitting: Advanced Practice Midwife

## 2023-11-29 ENCOUNTER — Other Ambulatory Visit: Payer: Self-pay

## 2023-11-29 ENCOUNTER — Encounter: Payer: Self-pay | Admitting: Advanced Practice Midwife

## 2023-11-29 VITALS — BP 129/73 | HR 64 | Ht 68.0 in | Wt 154.6 lb

## 2023-11-29 DIAGNOSIS — Z3009 Encounter for other general counseling and advice on contraception: Secondary | ICD-10-CM | POA: Diagnosis not present

## 2023-11-29 MED ORDER — PHEXXI 1.8-1-0.4 % VA GEL
5.0000 g | VAGINAL | 15 refills | Status: AC | PRN
Start: 2023-11-29 — End: ?

## 2023-11-29 NOTE — Progress Notes (Signed)
 Post Partum Visit Note  Maria Dudley is a 28 y.o. G29P2012 female who presents for a postpartum visit. She is 7 weeks postpartum following a primary cesarean section.  I have fully reviewed the prenatal and intrapartum course. The delivery was at 39.5 gestational weeks.  Anesthesia: spinal. Postpartum course has been goog. Baby is doing well. Baby is feeding by breast. Bleeding no bleeding. Bowel function is normal. Bladder function is normal. Patient is sexually active. Contraception method is condoms. Postpartum depression screening: negative.   The pregnancy intention screening data noted above was reviewed. Potential methods of contraception were discussed. The patient elected to proceed with No data recorded.   Edinburgh Postnatal Depression Scale - 11/29/23 1614       Edinburgh Postnatal Depression Scale:  In the Past 7 Days   I have been able to laugh and see the funny side of things. 0    I have looked forward with enjoyment to things. 0    I have blamed myself unnecessarily when things went wrong. 1    I have been anxious or worried for no good reason. 0    I have felt scared or panicky for no good reason. 0    Things have been getting on top of me. 0    I have been so unhappy that I have had difficulty sleeping. 2    I have felt sad or miserable. 2    I have been so unhappy that I have been crying. 1    The thought of harming myself has occurred to me. 0    Edinburgh Postnatal Depression Scale Total 6             Health Maintenance Due  Topic Date Due   Pneumococcal Vaccine 73-67 Years old (1 of 2 - PCV) Never done   Cervical Cancer Screening (Pap smear)  Never done   COVID-19 Vaccine (1 - 2024-25 season) Never done    The following portions of the patient's history were reviewed and updated as appropriate: allergies, current medications, past family history, past medical history, past social history, past surgical history, and problem list.  Review of  Systems Pertinent items noted in HPI and remainder of comprehensive ROS otherwise negative.  Objective:  BP 129/73   Pulse 64   Ht 5\' 8"  (1.727 m)   Wt 154 lb 9.6 oz (70.1 kg)   Breastfeeding Yes   BMI 23.51 kg/m    VS reviewed, nursing note reviewed,  Constitutional: well developed, well nourished, no distress HEENT: normocephalic CV: normal rate Pulm/chest wall: normal effort Abdomen: soft Neuro: alert and oriented x 3 Skin: warm, dry Psych: affect normal    Assessment:   1. Postpartum care following cesarean delivery --Doing well, bonding well with baby, good support at home.    2. Encounter for counseling regarding contraception (Primary) --Pt does not desire hormones, was planning condoms, but will try Phexxi and see if she wants to continue. - Lactic Ac-Citric Ac-Pot Bitart (PHEXXI) 1.8-1-0.4 % GEL; Place 5 g vaginally as needed.  Dispense: 60 g; Refill: 15   Plan:   Essential components of care per ACOG recommendations:  1.  Mood and well being: Patient with negative depression screening today. Reviewed local resources for support.  - Patient tobacco use? No.   - hx of drug use? No.    2. Infant care and feeding:  -Patient currently breastmilk feeding? Yes. Reviewed importance of draining breast regularly to support lactation.  -Social determinants  of health (SDOH) reviewed in EPIC. No concerns    3. Sexuality, contraception and birth spacing - Patient does not want a pregnancy in the next year.   - Reviewed reproductive life planning. Reviewed contraceptive methods based on pt preferences and effectiveness.  Patient desired Phexxi vaginal gel today.   - Discussed birth spacing of 18 months  4. Sleep and fatigue -Encouraged family/partner/community support of 4 hrs of uninterrupted sleep to help with mood and fatigue  5. Physical Recovery  - Discussed patients delivery and complications. She describes her labor as mixed. - Patient had a C-section  emergent.  - Patient has urinary incontinence? No. - Patient is safe to resume physical and sexual activity  6.  Health Maintenance - HM due items addressed No - Pap not collected. Pt with infant in office today.  - Last pap smear unknown.  Pap smear not done at today's visit. Pt to schedule annual with Pap. -Breast Cancer screening indicated? No.   7. Chronic Disease/Pregnancy Condition follow up: None  - PCP follow up  Sharen Counter, CNM [facility] [employer], [facility]

## 2023-12-13 ENCOUNTER — Encounter: Payer: Self-pay | Admitting: Nurse Practitioner

## 2023-12-14 ENCOUNTER — Encounter: Payer: Self-pay | Admitting: *Deleted

## 2024-02-17 ENCOUNTER — Encounter: Payer: Self-pay | Admitting: Nurse Practitioner
